# Patient Record
Sex: Male | Born: 1950 | Race: White | Hispanic: No | Marital: Married | Smoking: Former smoker
Health system: Southern US, Community
[De-identification: ages and names within clinical notes are randomized; demographics above are authoritative.]

## PROBLEM LIST (undated history)

## (undated) DIAGNOSIS — I10 Essential (primary) hypertension: Secondary | ICD-10-CM

## (undated) DIAGNOSIS — M170 Bilateral primary osteoarthritis of knee: Secondary | ICD-10-CM

## (undated) DIAGNOSIS — E119 Type 2 diabetes mellitus without complications: Secondary | ICD-10-CM

## (undated) DIAGNOSIS — E785 Hyperlipidemia, unspecified: Secondary | ICD-10-CM

## (undated) HISTORY — PX: TUMOR REMOVAL: SHX12

---

## 2006-11-14 ENCOUNTER — Emergency Department: Payer: Self-pay | Admitting: Unknown Physician Specialty

## 2009-04-14 ENCOUNTER — Ambulatory Visit: Payer: Self-pay | Admitting: General Surgery

## 2009-04-15 ENCOUNTER — Ambulatory Visit: Payer: Self-pay | Admitting: General Surgery

## 2009-04-22 ENCOUNTER — Ambulatory Visit: Payer: Self-pay | Admitting: General Surgery

## 2009-04-24 HISTORY — PX: INGUINAL HERNIA REPAIR: SHX194

## 2018-10-03 ENCOUNTER — Other Ambulatory Visit (HOSPITAL_COMMUNITY): Payer: Self-pay | Admitting: Anesthesiology

## 2018-10-03 ENCOUNTER — Other Ambulatory Visit: Payer: Self-pay | Admitting: Anesthesiology

## 2018-10-03 DIAGNOSIS — G894 Chronic pain syndrome: Secondary | ICD-10-CM

## 2018-10-03 DIAGNOSIS — M542 Cervicalgia: Secondary | ICD-10-CM

## 2018-10-03 DIAGNOSIS — M25512 Pain in left shoulder: Secondary | ICD-10-CM

## 2018-10-03 DIAGNOSIS — G8929 Other chronic pain: Secondary | ICD-10-CM

## 2018-10-17 ENCOUNTER — Other Ambulatory Visit: Payer: Self-pay | Admitting: Anesthesiology

## 2018-10-17 DIAGNOSIS — Z1389 Encounter for screening for other disorder: Secondary | ICD-10-CM

## 2018-10-17 DIAGNOSIS — Z0189 Encounter for other specified special examinations: Secondary | ICD-10-CM

## 2018-10-18 ENCOUNTER — Ambulatory Visit
Admission: RE | Admit: 2018-10-18 | Discharge: 2018-10-18 | Disposition: A | Discharge status: Home / Self Care | Payer: Medicare Other | Source: Ambulatory Visit | Attending: Anesthesiology | Admitting: Anesthesiology

## 2018-10-18 ENCOUNTER — Other Ambulatory Visit: Payer: Self-pay

## 2018-10-18 DIAGNOSIS — M25512 Pain in left shoulder: Secondary | ICD-10-CM

## 2018-10-18 DIAGNOSIS — G8929 Other chronic pain: Secondary | ICD-10-CM | POA: Insufficient documentation

## 2018-10-18 DIAGNOSIS — M542 Cervicalgia: Secondary | ICD-10-CM

## 2018-10-18 DIAGNOSIS — Z0189 Encounter for other specified special examinations: Secondary | ICD-10-CM

## 2018-10-18 DIAGNOSIS — Z1389 Encounter for screening for other disorder: Secondary | ICD-10-CM | POA: Diagnosis present

## 2018-10-18 DIAGNOSIS — G894 Chronic pain syndrome: Secondary | ICD-10-CM

## 2020-05-24 ENCOUNTER — Other Ambulatory Visit
Admission: RE | Admit: 2020-05-24 | Discharge: 2020-05-24 | Disposition: A | Discharge status: Home / Self Care | Payer: Medicare Other | Source: Ambulatory Visit | Attending: Pediatrics | Admitting: Pediatrics

## 2020-05-24 ENCOUNTER — Emergency Department: Payer: Medicare Other

## 2020-05-24 ENCOUNTER — Emergency Department
Admission: EM | Admit: 2020-05-24 | Discharge: 2020-05-24 | Disposition: A | Discharge status: Home / Self Care | Payer: Medicare Other | Attending: Emergency Medicine | Admitting: Emergency Medicine

## 2020-05-24 ENCOUNTER — Other Ambulatory Visit: Payer: Self-pay

## 2020-05-24 DIAGNOSIS — R42 Dizziness and giddiness: Secondary | ICD-10-CM | POA: Insufficient documentation

## 2020-05-24 DIAGNOSIS — Z03818 Encounter for observation for suspected exposure to other biological agents ruled out: Secondary | ICD-10-CM | POA: Insufficient documentation

## 2020-05-24 DIAGNOSIS — J1282 Pneumonia due to coronavirus disease 2019: Secondary | ICD-10-CM | POA: Diagnosis not present

## 2020-05-24 DIAGNOSIS — I10 Essential (primary) hypertension: Secondary | ICD-10-CM | POA: Insufficient documentation

## 2020-05-24 DIAGNOSIS — R0602 Shortness of breath: Secondary | ICD-10-CM | POA: Insufficient documentation

## 2020-05-24 DIAGNOSIS — U071 COVID-19: Secondary | ICD-10-CM | POA: Diagnosis not present

## 2020-05-24 HISTORY — DX: Essential (primary) hypertension: I10

## 2020-05-24 LAB — BASIC METABOLIC PANEL
Anion gap: 12 (ref 5–15)
BUN: 26 mg/dL — ABNORMAL HIGH (ref 8–23)
CO2: 24 mmol/L (ref 22–32)
Calcium: 9.1 mg/dL (ref 8.9–10.3)
Chloride: 105 mmol/L (ref 98–111)
Creatinine, Ser: 1.26 mg/dL — ABNORMAL HIGH (ref 0.61–1.24)
GFR, Estimated: >60 mL/min (ref >60)
Glucose, Bld: 121 mg/dL — ABNORMAL HIGH (ref 70–99)
Potassium: 3.8 mmol/L (ref 3.5–5.1)
Sodium: 141 mmol/L (ref 135–145)

## 2020-05-24 LAB — FIBRIN DERIVATIVES D-DIMER (ARMC ONLY): Fibrin derivatives D-dimer (ARMC): 602.39 ng/mL (FEU) — ABNORMAL HIGH (ref 0.00–499.00)

## 2020-05-24 LAB — CBC
HCT: 43.9 % (ref 39.0–52.0)
Hemoglobin: 15.1 g/dL (ref 13.0–17.0)
MCH: 31.9 pg (ref 26.0–34.0)
MCHC: 34.4 g/dL (ref 30.0–36.0)
MCV: 92.8 fL (ref 80.0–100.0)
Platelets: 162 10*3/uL (ref 150–400)
RBC: 4.73 MIL/uL (ref 4.22–5.81)
RDW: 13 % (ref 11.5–15.5)
WBC: 5.2 10*3/uL (ref 4.0–10.5)
nRBC: 0 % (ref 0.0–0.2)

## 2020-05-24 LAB — TROPONIN I (HIGH SENSITIVITY)
Troponin I (High Sensitivity): 5 ng/L (ref <18)
Troponin I (High Sensitivity): 8 ng/L (ref <18)

## 2020-05-24 MED ORDER — PREDNISONE 10 MG PO TABS: 40.0000 mg | ORAL_TABLET | Freq: Every day | ORAL | 0 refills | Status: DC

## 2020-05-24 MED ORDER — PREDNISONE 10 MG PO TABS: 40.0000 mg | ORAL_TABLET | Freq: Every day | ORAL | 0 refills | Status: AC

## 2020-05-24 MED ORDER — AZITHROMYCIN 250 MG PO TABS: ORAL_TABLET | ORAL | 0 refills | Status: AC

## 2020-05-24 MED ORDER — ONDANSETRON 4 MG PO TBDP: 4.0000 mg | ORAL_TABLET | Freq: Three times a day (TID) | ORAL | 0 refills | Status: DC | PRN

## 2020-05-24 MED ORDER — IOHEXOL 350 MG/ML SOLN
75.0000 mL | Freq: Once | INTRAVENOUS | Status: AC | PRN
  Administered 2020-05-24: 75 mL via INTRAVENOUS
  Filled 2020-05-24: qty 75

## 2020-05-24 MED ORDER — AZITHROMYCIN 250 MG PO TABS: ORAL_TABLET | ORAL | 0 refills | Status: DC

## 2020-05-24 MED ORDER — SODIUM CHLORIDE 0.9 % IV BOLUS
500.0000 mL | Freq: Once | INTRAVENOUS | Status: AC
  Administered 2020-05-24: 500 mL via INTRAVENOUS

## 2020-05-24 NOTE — ED Triage Notes (Addendum)
Pt comes via [facility] with c/o CP and COVID+. Pt states he went to [facility] for his dizziness and CP. Pt states this started couple weeks ago and has just gotten worse. Pt states they tested him for COVID at [facility] and it was +.  Pt states mid sternal CP. Pt states SOB. Pt states weakness.  Pt states they did a xray and blood work at [facility]. No new xray ordered.

## 2020-05-24 NOTE — ED Notes (Signed)
Pt ambulated to/from toilet. Well tolerated. Steady on feet. SpO2 at start was 94%, 97% while ambulating. No dizziness, SOB, lightheadedness, weakness. EDP notified

## 2020-05-24 NOTE — ED Provider Notes (Signed)
[facility] Emergency Department Provider Note  ____________________________________________   Event Date/Time   First MD Initiated Contact with Patient 05/24/20 1938     (approximate)  I have reviewed the triage vital signs    HISTORY  Chief Complaint Chest Pain and Covid Positive    HPI Alec Jennings is a 70 y.o. male who presents with viral symptoms. Pt reports multiple symptoms including dizziness, chest pain, shortness of breath. Patient was Covid positive but also had a D-dimer that was elevated at his primary care doctor so sent in for CT scan to rule out pulmonary embolism he does report some chest pain and pain with breathing that is moderate, constant, nothing makes it better nothing makes it worse. He states that he has been having symptoms for 3 weeks but is got worse over the past 3 days. He is not vaccinated.     Past Medical History:  Diagnosis Date  . Hypertension     There are no problems to display for this patient.   History reviewed. No pertinent surgical history.  Prior to Admission medications   Not on File    Allergies Patient has no allergy information on record.  No family history on file.  Social History      Review of Systems Constitutional: No fever/chills Eyes: No visual changes. ENT: No sore throat. Cardiovascular: Chest pain Respiratory:  shortness of breath Gastrointestinal: No abdominal pain.  No nausea, no vomiting.  No diarrhea.  No constipation. Genitourinary: Negative for dysuria. Musculoskeletal: Negative for back pain. Skin: Negative for rash. Neurological: Negative for headaches, focal weakness or numbness. All other ROS negative ____________________________________________   PHYSICAL EXAM:  VITAL SIGNS: ED Triage Vitals  Enc Vitals Group     BP 05/24/20 1546 (!) 146/89     Pulse Rate 05/24/20 1546 96     Resp 05/24/20 1546 19     Temp 05/24/20 1546 98.4 F (36.9 C)     Temp Source  05/24/20 1908 Oral     SpO2 05/24/20 1546 96 %     Weight --      Height --      Head Circumference --      Peak Flow --      Pain Score 05/24/20 1542 3     Pain Loc --      Pain Edu? --      Excl. in GC? --     Constitutional: Alert and oriented. Well appearing and in no acute distress. Eyes: Conjunctivae are normal. EOMI. Head: Atraumatic. Nose: No congestion/rhinnorhea. Mouth/Throat: Mucous membranes are moist.   Neck: No stridor. Trachea Midline. FROM Cardiovascular: Normal rate, regular rhythm. Good peripheral circulation. Respiratory: no audible stridor, no increased work of breathing  Gastrointestinal: Soft and nontender. No distention.  Musculoskeletal: No lower extremity tenderness nor edema.  No joint effusions. Neurologic:  Normal speech and language. No gross focal neurologic deficits are appreciated.  Skin:  Skin is warm, dry and intact. No rash noted. Psychiatric: Mood and affect are normal. Speech and behavior are normal. GU: Deferred   ____________________________________________   LABS (all labs ordered are listed, but only abnormal results are displayed)  Labs Reviewed  BASIC METABOLIC PANEL - Abnormal; Notable for the following components:      Result Value   Glucose, Bld 121 (*)    BUN 26 (*)    Creatinine, Ser 1.26 (*)    All other components within normal limits  CBC  TROPONIN I (HIGH  SENSITIVITY)  TROPONIN I (HIGH SENSITIVITY)   ____________________________________________   RADIOLOGY   Official radiology report(s): CT Angio Chest PE W and/or Wo Contrast  Result Date: 05/24/2020 CLINICAL DATA:  PE suspected.  Chest pain.  COVID positive. EXAM: CT ANGIOGRAPHY CHEST WITH CONTRAST TECHNIQUE: Multidetector CT imaging of the chest was performed using the standard protocol during bolus administration of intravenous contrast. Multiplanar CT image reconstructions and MIPs were obtained to evaluate the vascular anatomy. CONTRAST:  31mL OMNIPAQUE  IOHEXOL 350 MG/ML SOLN COMPARISON:  None. FINDINGS: Cardiovascular: Contrast injection is sufficient to demonstrate satisfactory opacification of the pulmonary arteries to the segmental level. There is no pulmonary embolus or evidence of right heart strain. The size of the main pulmonary artery is normal. Heart size is normal, with no pericardial effusion. The course and caliber of the aorta are normal. There is no atherosclerotic calcification. Opacification decreased due to pulmonary arterial phase contrast bolus timing. Mediastinum/Nodes: -- No mediastinal lymphadenopathy. -- No hilar lymphadenopathy. -- No axillary lymphadenopathy. -- No supraclavicular lymphadenopathy. -- Normal thyroid gland where visualized. -  Unremarkable esophagus. Lungs/Pleura: There is scattered bilateral ground-glass airspace opacities. There are emphysematous changes. No pneumothorax. There are trace bilateral pleural effusions. The trachea is unremarkable. Upper Abdomen: Contrast bolus timing is not optimized for evaluation of the abdominal organs. The visualized portions of the organs of the upper abdomen are normal. Musculoskeletal: No chest wall abnormality. No bony spinal canal stenosis. Review of the MIP images confirms the above findings. IMPRESSION: 1. No acute pulmonary embolism. 2. Bilateral ground-glass airspace opacities, consistent with the patient's history of viral pneumonia. 3. Trace bilateral pleural effusions. Emphysema (ICD10-J43.9). Electronically Signed   By: Katherine Mantle M.D.   On: 05/24/2020 21:41    ____________________________________________   PROCEDURES  Procedure(s) performed (including Critical Care):  Procedures   ____________________________________________   INITIAL IMPRESSION / ASSESSMENT AND PLAN / ED COURSE  [name] was evaluated in Emergency Department on 05/24/2020 for the symptoms described in the history of present illness. He was evaluated in the context of the global  COVID-19 pandemic, which necessitated consideration that the patient might be at risk for infection with the SARS-CoV-2 virus that causes COVID-19. Institutional protocols and algorithms that pertain to the evaluation of patients at risk for COVID-19 are in a state of rapid change based on information released by regulatory bodies including the CDC and federal and state organizations. These policies and algorithms were followed during the patient's care in the ED.     Pt presents with multiple symptoms.  Given the prevalence of  COVID 19 I suspect this is mostly likely secondary to viral illness such as COVID 19. Pt had + test at PCP.   Well hydrated on exam, low suspicion for electrolyte abnormalities or AKI. Pt not hypoxic and breathing well therefore does not require admission to hospital. Patient's is reporting chest pains will get cardiac markers and EKG. He was sent here because his D-dimer was elevated so we will get CT PE supply suspect this is mostly just elevated from his COVID-19   No signs of a heart attack.  Kidney function slightly elevated at 1.26. 500 cc of fluid given  Patient's oxygen level was 94% and with ambulation went no lower than that.  He denied any significant shortness of breath or dizziness.  CT scan did not show any evidence of PE just showed signs of his Covid pneumonia.  Discussed symptoms management with tylenol, steroids, azithromycin.  Discussed with patient that if  his shortness of breath is worsening he should return to the ER for repeat oxygen level testing.        ____________________________________________   FINAL CLINICAL IMPRESSION(S) / ED DIAGNOSES   Final diagnoses:  Pneumonia due to COVID-19 virus      MEDICATIONS GIVEN DURING THIS VISIT:  Medications  sodium chloride 0.9 % bolus 500 mL (0 mLs Intravenous Stopped 05/24/20 2213)  iohexol (OMNIPAQUE) 350 MG/ML injection 75 mL (75 mLs Intravenous Contrast Given 05/24/20 2127)     ED  Discharge Orders         Ordered    ondansetron (ZOFRAN ODT) 4 MG disintegrating tablet  Every 8 hours PRN        05/24/20 2217    predniSONE (DELTASONE) 10 MG tablet  Daily        05/24/20 2217    azithromycin (ZITHROMAX Z-PAK) 250 MG tablet        05/24/20 2217    Ambulatory referral for Covid Treatment        05/24/20 2218           Note:  This document was prepared using Dragon voice recognition software and may include unintentional dictation errors.   Concha Se, MD 05/24/20 2219

## 2020-05-24 NOTE — Discharge Instructions (Signed)
Take the steroids and azithromycin to help with the Covid that is in your lungs.  Take Zofran to help with nausea.  Take Tylenol 1 g every 8 hours to help with any pain and fevers.  Your kidney function was slightly elevated which can be a sign of some mild dehydration so drink plenty of fluids such as Gatorade without sugar in it or Pedialyte.  Return to the ER if you develop worsening shortness of breath you your oxygen level will need to be rechecked for consideration of admission.

## 2020-05-25 ENCOUNTER — Telehealth: Payer: Self-pay | Admitting: Adult Health

## 2020-05-25 NOTE — Telephone Encounter (Signed)
Called to discuss with patient about COVID-19 symptoms and the use of one of the available treatments for those with mild to moderate Covid symptoms and at a high risk of hospitalization.  Pt appears to qualify for outpatient treatment due to co-morbid conditions and/or a member of an at-risk group in accordance with the FDA Emergency Use Authorization.    Symptom onset: >7 d Vaccinated: No  Booster? No  Immunocompromised? No  Qualifiers: Yes   Unable to reach pt - Reached patient , Sx >7d, outside of OP treatment options for Covid with MAB and antivirals. Advised to continue with ER recs  Please contact PCP  for sooner follow up if symptoms do not improve or worsen or seek emergency care    Milah Recht NP-  C

## 2021-03-20 ENCOUNTER — Other Ambulatory Visit: Payer: Self-pay | Admitting: Orthopedic Surgery

## 2021-03-20 DIAGNOSIS — M2419 Other articular cartilage disorders, other specified site: Secondary | ICD-10-CM

## 2021-03-20 DIAGNOSIS — M2559 Pain in other specified joint: Secondary | ICD-10-CM

## 2021-03-20 DIAGNOSIS — Z01818 Encounter for other preprocedural examination: Secondary | ICD-10-CM

## 2021-03-29 ENCOUNTER — Other Ambulatory Visit
Admission: RE | Admit: 2021-03-29 | Discharge: 2021-03-29 | Disposition: A | Discharge status: Home / Self Care | Payer: Medicare Other | Source: Ambulatory Visit | Attending: Orthopedic Surgery | Admitting: Orthopedic Surgery

## 2021-03-29 ENCOUNTER — Other Ambulatory Visit: Payer: Self-pay

## 2021-03-29 VITALS — BP 159/81 | HR 82 | Temp 98.0°F | Resp 12 | Ht 70.5 in | Wt 222.0 lb

## 2021-03-29 DIAGNOSIS — I10 Essential (primary) hypertension: Secondary | ICD-10-CM | POA: Insufficient documentation

## 2021-03-29 DIAGNOSIS — M2419 Other articular cartilage disorders, other specified site: Secondary | ICD-10-CM | POA: Insufficient documentation

## 2021-03-29 DIAGNOSIS — Z01818 Encounter for other preprocedural examination: Secondary | ICD-10-CM | POA: Insufficient documentation

## 2021-03-29 DIAGNOSIS — Z01812 Encounter for preprocedural laboratory examination: Secondary | ICD-10-CM

## 2021-03-29 DIAGNOSIS — Z20822 Contact with and (suspected) exposure to covid-19: Secondary | ICD-10-CM | POA: Insufficient documentation

## 2021-03-29 DIAGNOSIS — M2559 Pain in other specified joint: Secondary | ICD-10-CM | POA: Diagnosis not present

## 2021-03-29 DIAGNOSIS — E119 Type 2 diabetes mellitus without complications: Secondary | ICD-10-CM | POA: Diagnosis not present

## 2021-03-29 HISTORY — DX: Hyperlipidemia, unspecified: E78.5

## 2021-03-29 HISTORY — DX: Bilateral primary osteoarthritis of knee: M17.0

## 2021-03-29 HISTORY — DX: Type 2 diabetes mellitus without complications: E11.9

## 2021-03-29 LAB — URINALYSIS, COMPLETE (UACMP) WITH MICROSCOPIC
Bacteria, UA: NONE SEEN
Bilirubin Urine: NEGATIVE
Glucose, UA: NEGATIVE mg/dL
Hgb urine dipstick: NEGATIVE
Ketones, ur: NEGATIVE mg/dL
Leukocytes,Ua: NEGATIVE
Nitrite: NEGATIVE
Protein, ur: NEGATIVE mg/dL
Specific Gravity, Urine: 1.011 (ref 1.005–1.030)
Squamous Epithelial / HPF: NONE SEEN (ref 0–5)
WBC, UA: NONE SEEN WBC/hpf (ref 0–5)
pH: 5 (ref 5.0–8.0)

## 2021-03-29 LAB — BASIC METABOLIC PANEL
Anion gap: 7 (ref 5–15)
BUN: 24 mg/dL — ABNORMAL HIGH (ref 8–23)
CO2: 28 mmol/L (ref 22–32)
Calcium: 9.2 mg/dL (ref 8.9–10.3)
Chloride: 103 mmol/L (ref 98–111)
Creatinine, Ser: 1.36 mg/dL — ABNORMAL HIGH (ref 0.61–1.24)
GFR, Estimated: 56 mL/min — ABNORMAL LOW (ref >60)
Glucose, Bld: 96 mg/dL (ref 70–99)
Potassium: 3.4 mmol/L — ABNORMAL LOW (ref 3.5–5.1)
Sodium: 138 mmol/L (ref 135–145)

## 2021-03-29 LAB — TYPE AND SCREEN
ABO/RH(D): O POS
Antibody Screen: NEGATIVE

## 2021-03-29 LAB — CBC WITH DIFFERENTIAL/PLATELET
Abs Immature Granulocytes: 0.01 10*3/uL (ref 0.00–0.07)
Basophils Absolute: 0 10*3/uL (ref 0.0–0.1)
Basophils Relative: 1 %
Eosinophils Absolute: 0.2 10*3/uL (ref 0.0–0.5)
Eosinophils Relative: 4 %
HCT: 39.9 % (ref 39.0–52.0)
Hemoglobin: 13.9 g/dL (ref 13.0–17.0)
Immature Granulocytes: 0 %
Lymphocytes Relative: 39 %
Lymphs Abs: 2.1 10*3/uL (ref 0.7–4.0)
MCH: 32.4 pg (ref 26.0–34.0)
MCHC: 34.8 g/dL (ref 30.0–36.0)
MCV: 93 fL (ref 80.0–100.0)
Monocytes Absolute: 0.5 10*3/uL (ref 0.1–1.0)
Monocytes Relative: 9 %
Neutro Abs: 2.5 10*3/uL (ref 1.7–7.7)
Neutrophils Relative %: 47 %
Platelets: 194 10*3/uL (ref 150–400)
RBC: 4.29 MIL/uL (ref 4.22–5.81)
RDW: 13.8 % (ref 11.5–15.5)
WBC: 5.4 10*3/uL (ref 4.0–10.5)
nRBC: 0 % (ref 0.0–0.2)

## 2021-03-29 LAB — APTT: aPTT: 33 seconds (ref 24–36)

## 2021-03-29 LAB — SURGICAL PCR SCREEN
MRSA, PCR: NEGATIVE
Staphylococcus aureus: NEGATIVE

## 2021-03-29 LAB — PROTIME-INR
INR: 1.1 (ref 0.8–1.2)
Prothrombin Time: 13.7 seconds (ref 11.4–15.2)

## 2021-03-29 NOTE — Patient Instructions (Addendum)
Your procedure is scheduled on: Thursday, December 8 Report to the Registration Desk on the 1st floor of the [facility] To find out your arrival time, please call [phone] between 1PM - 3PM on: Wednesday, December 7  REMEMBER: Instructions that are not followed completely may result in serious medical risk, up to and including death; or upon the discretion of your surgeon and anesthesiologist your surgery may need to be rescheduled.  Do not eat food after midnight the night before surgery.  No gum chewing, lozengers or hard candies.  You may however, drink CLEAR liquids up to 2 hours before you are scheduled to arrive for your surgery. Do not drink anything within 2 hours of your scheduled arrival time.  TAKE THESE MEDICATIONS THE MORNING OF SURGERY WITH A SIP OF WATER:  Metoprolol  Stop Metformin 2 days prior to surgery. Last day to take is Monday, December 5. Resume AFTER surgery.  One week prior to surgery: Stop aspirin, diclofenac and Anti-inflammatories (NSAIDS) such as Advil, Aleve, Ibuprofen, Motrin, Naproxen, Naprosyn and Aspirin based products such as Excedrin, Goodys Powder, BC Powder. Stop ANY OVER THE COUNTER supplements until after surgery. Stop vitamin C You may however, continue to take Tylenol if needed for pain up until the day of surgery.  No Alcohol for 24 hours before or after surgery.  No Smoking including e-cigarettes for 24 hours prior to surgery.  No chewable tobacco products for at least 6 hours prior to surgery.  No nicotine patches on the day of surgery.  Do not use any "recreational" drugs for at least a week prior to your surgery.  Please be advised that the combination of cocaine and anesthesia may have negative outcomes, up to and including death. If you test positive for cocaine, your surgery will be cancelled.  On the morning of surgery brush your teeth with toothpaste and water, you may rinse your mouth with mouthwash if you wish. Do not  swallow any toothpaste or mouthwash.  Use CHG Soap as directed on instruction sheet.  Do not wear jewelry.  Do not wear lotions, powders, or perfumes.   Do not shave body from the neck down 48 hours prior to surgery just in case you cut yourself which could leave a site for infection.  Also, freshly shaved skin may become irritated if using the CHG soap.  Do not bring valuables to the hospital. [facility] is not responsible for any missing/lost belongings or valuables.   Notify your doctor if there is any change in your medical condition (cold, fever, infection).  Wear comfortable clothing (specific to your surgery type) to the hospital.  After surgery, you can help prevent lung complications by doing breathing exercises.  Take deep breaths and cough every 1-2 hours. Your doctor may order a device called an Incentive Spirometer to help you take deep breaths.  If you are being admitted to the hospital overnight, leave your suitcase in the car. After surgery it may be brought to your room.  If you are being discharged the day of surgery, you will not be allowed to drive home. You will need a responsible adult (18 years or older) to drive you home and stay with you that night.   If you are taking public transportation, you will need to have a responsible adult (18 years or older) with you. Please confirm with your physician that it is acceptable to use public transportation.   Please call the Pre-admissions Testing Dept. at [phone] if you  have any questions about these instructions.  Surgery Visitation Policy:  Patients undergoing a surgery or procedure may have one family member or support person with them as long as that person is not COVID-19 positive or experiencing its symptoms.  That person may remain in the waiting area during the procedure and may rotate out with other people.  Inpatient Visitation:    Visiting hours are 7 a.m. to 8 p.m. Up to two visitors ages 16+  are allowed at one time in a patient room. The visitors may rotate out with other people during the day. Visitors must check out when they leave, or other visitors will not be allowed. One designated support person may remain overnight. The visitor must pass COVID-19 screenings, use hand sanitizer when entering and exiting the patient's room and wear a mask at all times, including in the patient's room. Patients must also wear a mask when staff or their visitor are in the room. Masking is required regardless of vaccination status.

## 2021-03-30 LAB — SARS CORONAVIRUS 2 (TAT 6-24 HRS): SARS Coronavirus 2: NEGATIVE

## 2021-03-31 ENCOUNTER — Encounter
Admission: RE | Disposition: A | Discharge status: Home / Self Care | Payer: Self-pay | Source: Home / Self Care | Attending: Orthopedic Surgery

## 2021-03-31 ENCOUNTER — Ambulatory Visit: Payer: Medicare Other

## 2021-03-31 ENCOUNTER — Ambulatory Visit: Payer: Medicare Other | Admitting: Anesthesiology

## 2021-03-31 ENCOUNTER — Observation Stay
Admission: RE | Admit: 2021-03-31 | Discharge: 2021-04-02 | Disposition: A | Discharge status: Home / Self Care | Payer: Medicare Other | Attending: Orthopedic Surgery | Admitting: Orthopedic Surgery

## 2021-03-31 ENCOUNTER — Other Ambulatory Visit: Payer: Self-pay

## 2021-03-31 ENCOUNTER — Encounter: Payer: Self-pay | Admitting: Orthopedic Surgery

## 2021-03-31 DIAGNOSIS — E119 Type 2 diabetes mellitus without complications: Secondary | ICD-10-CM | POA: Insufficient documentation

## 2021-03-31 DIAGNOSIS — Z79899 Other long term (current) drug therapy: Secondary | ICD-10-CM | POA: Diagnosis not present

## 2021-03-31 DIAGNOSIS — Z7984 Long term (current) use of oral hypoglycemic drugs: Secondary | ICD-10-CM | POA: Insufficient documentation

## 2021-03-31 DIAGNOSIS — I10 Essential (primary) hypertension: Secondary | ICD-10-CM | POA: Insufficient documentation

## 2021-03-31 DIAGNOSIS — M1712 Unilateral primary osteoarthritis, left knee: Secondary | ICD-10-CM | POA: Diagnosis not present

## 2021-03-31 DIAGNOSIS — Z7982 Long term (current) use of aspirin: Secondary | ICD-10-CM | POA: Insufficient documentation

## 2021-03-31 DIAGNOSIS — Z87891 Personal history of nicotine dependence: Secondary | ICD-10-CM | POA: Insufficient documentation

## 2021-03-31 DIAGNOSIS — Z96652 Presence of left artificial knee joint: Secondary | ICD-10-CM

## 2021-03-31 HISTORY — PX: TOTAL KNEE ARTHROPLASTY: SHX125

## 2021-03-31 LAB — CBC
HCT: 35.5 % — ABNORMAL LOW (ref 39.0–52.0)
Hemoglobin: 12.5 g/dL — ABNORMAL LOW (ref 13.0–17.0)
MCH: 32.6 pg (ref 26.0–34.0)
MCHC: 35.2 g/dL (ref 30.0–36.0)
MCV: 92.4 fL (ref 80.0–100.0)
Platelets: 175 10*3/uL (ref 150–400)
RBC: 3.84 MIL/uL — ABNORMAL LOW (ref 4.22–5.81)
RDW: 14.1 % (ref 11.5–15.5)
WBC: 5.9 10*3/uL (ref 4.0–10.5)
nRBC: 0 % (ref 0.0–0.2)

## 2021-03-31 LAB — GLUCOSE, CAPILLARY
Glucose-Capillary: 126 mg/dL — ABNORMAL HIGH (ref 70–99)
Glucose-Capillary: 114 mg/dL — ABNORMAL HIGH (ref 70–99)
Glucose-Capillary: 121 mg/dL — ABNORMAL HIGH (ref 70–99)

## 2021-03-31 LAB — CREATININE, SERUM
Creatinine, Ser: 1.3 mg/dL — ABNORMAL HIGH (ref 0.61–1.24)
GFR, Estimated: 59 mL/min — ABNORMAL LOW (ref >60)

## 2021-03-31 LAB — ABO/RH: ABO/RH(D): O POS

## 2021-03-31 SURGERY — ARTHROPLASTY, KNEE, TOTAL
Anesthesia: Spinal | Site: Knee | Laterality: Left

## 2021-03-31 MED ORDER — EPHEDRINE SULFATE 50 MG/ML IJ SOLN
INTRAMUSCULAR | Status: DC | PRN
Start: 2021-03-31 — End: 2021-03-31
  Administered 2021-03-31 (×2): 5 mg via INTRAVENOUS

## 2021-03-31 MED ORDER — OXYCODONE HCL 5 MG PO TABS
5.0000 mg | ORAL_TABLET | ORAL | Status: DC | PRN
  Administered 2021-04-01: 5 mg via ORAL
  Administered 2021-04-01 – 2021-04-02 (×2): 10 mg via ORAL
  Filled 2021-03-31: qty 1
  Filled 2021-03-31 (×2): qty 2

## 2021-03-31 MED ORDER — OXYCODONE HCL 5 MG PO TABS: 5.0000 mg | ORAL_TABLET | Freq: Once | ORAL | Status: DC | PRN

## 2021-03-31 MED ORDER — TRAMADOL HCL 50 MG PO TABS
50.0000 mg | ORAL_TABLET | Freq: Four times a day (QID) | ORAL | Status: DC
  Administered 2021-03-31 – 2021-04-02 (×9): 50 mg via ORAL
  Filled 2021-03-31 (×9): qty 1

## 2021-03-31 MED ORDER — METFORMIN HCL 500 MG PO TABS
1000.0000 mg | ORAL_TABLET | Freq: Two times a day (BID) | ORAL | Status: DC
  Administered 2021-03-31 – 2021-04-02 (×4): 1000 mg via ORAL
  Filled 2021-03-31 (×4): qty 2

## 2021-03-31 MED ORDER — PHENYLEPHRINE HCL-NACL 20-0.9 MG/250ML-% IV SOLN
INTRAVENOUS | Status: AC
  Filled 2021-03-31: qty 250

## 2021-03-31 MED ORDER — PROPOFOL 10 MG/ML IV BOLUS
INTRAVENOUS | Status: DC | PRN
  Administered 2021-03-31: 40 mg via INTRAVENOUS

## 2021-03-31 MED ORDER — MORPHINE SULFATE (PF) 4 MG/ML IV SOLN
INTRAVENOUS | Status: AC
  Filled 2021-03-31: qty 1

## 2021-03-31 MED ORDER — PHENOL 1.4 % MT LIQD
1.0000 | OROMUCOSAL | Status: DC | PRN
  Filled 2021-03-31: qty 177

## 2021-03-31 MED ORDER — OXYCODONE HCL 5 MG/5ML PO SOLN: 5.0000 mg | Freq: Once | ORAL | Status: DC | PRN

## 2021-03-31 MED ORDER — NEOMYCIN-POLYMYXIN B GU 40-200000 IR SOLN
Status: DC | PRN
  Administered 2021-03-31: 14 mL

## 2021-03-31 MED ORDER — ONDANSETRON HCL 4 MG/2ML IJ SOLN: 4.0000 mg | Freq: Once | INTRAMUSCULAR | Status: DC | PRN

## 2021-03-31 MED ORDER — DIPHENHYDRAMINE HCL 12.5 MG/5ML PO ELIX: 12.5000 mg | ORAL_SOLUTION | ORAL | Status: DC | PRN

## 2021-03-31 MED ORDER — BUPIVACAINE HCL (PF) 0.5 % IJ SOLN
INTRAMUSCULAR | Status: AC
  Filled 2021-03-31: qty 10

## 2021-03-31 MED ORDER — SENNOSIDES-DOCUSATE SODIUM 8.6-50 MG PO TABS
1.0000 | ORAL_TABLET | Freq: Every evening | ORAL | Status: DC | PRN
  Administered 2021-04-01: 1 via ORAL
  Filled 2021-03-31: qty 1

## 2021-03-31 MED ORDER — SODIUM CHLORIDE 0.9 % IV SOLN: INTRAVENOUS | Status: DC

## 2021-03-31 MED ORDER — BISACODYL 5 MG PO TBEC
5.0000 mg | DELAYED_RELEASE_TABLET | Freq: Every day | ORAL | Status: DC | PRN
  Administered 2021-04-02: 5 mg via ORAL
  Filled 2021-03-31: qty 1

## 2021-03-31 MED ORDER — ONDANSETRON HCL 4 MG PO TABS: 4.0000 mg | ORAL_TABLET | Freq: Four times a day (QID) | ORAL | Status: DC | PRN

## 2021-03-31 MED ORDER — EZETIMIBE 10 MG PO TABS
10.0000 mg | ORAL_TABLET | Freq: Every day | ORAL | Status: DC
  Administered 2021-03-31 – 2021-04-01 (×2): 10 mg via ORAL
  Filled 2021-03-31 (×3): qty 1

## 2021-03-31 MED ORDER — MORPHINE SULFATE 4 MG/ML IJ SOLN
INTRAMUSCULAR | Status: DC | PRN
  Administered 2021-03-31: 4 mg

## 2021-03-31 MED ORDER — BUPIVACAINE HCL (PF) 0.5 % IJ SOLN
INTRAMUSCULAR | Status: DC | PRN
  Administered 2021-03-31: 2.5 mL

## 2021-03-31 MED ORDER — METOPROLOL SUCCINATE ER 50 MG PO TB24
50.0000 mg | ORAL_TABLET | Freq: Every day | ORAL | Status: DC
  Administered 2021-04-01 – 2021-04-02 (×2): 50 mg via ORAL
  Filled 2021-03-31 (×2): qty 1

## 2021-03-31 MED ORDER — SODIUM CHLORIDE 0.9 % IR SOLN
Status: DC | PRN
  Administered 2021-03-31: 3000 mL
  Administered 2021-03-31: 500 mL

## 2021-03-31 MED ORDER — BUPIVACAINE LIPOSOME 1.3 % IJ SUSP
INTRAMUSCULAR | Status: DC | PRN
  Administered 2021-03-31: 20 mL

## 2021-03-31 MED ORDER — CHLORHEXIDINE GLUCONATE CLOTH 2 % EX PADS: 6.0000 | MEDICATED_PAD | Freq: Once | CUTANEOUS | Status: DC

## 2021-03-31 MED ORDER — DOCUSATE SODIUM 100 MG PO CAPS
100.0000 mg | ORAL_CAPSULE | Freq: Two times a day (BID) | ORAL | Status: DC
  Administered 2021-03-31 – 2021-04-02 (×4): 100 mg via ORAL
  Filled 2021-03-31 (×4): qty 1

## 2021-03-31 MED ORDER — EPHEDRINE 5 MG/ML INJ
INTRAVENOUS | Status: AC
  Filled 2021-03-31: qty 5

## 2021-03-31 MED ORDER — ENOXAPARIN SODIUM 40 MG/0.4ML IJ SOSY
40.0000 mg | PREFILLED_SYRINGE | INTRAMUSCULAR | Status: DC
  Administered 2021-04-01 – 2021-04-02 (×2): 40 mg via SUBCUTANEOUS
  Filled 2021-03-31 (×2): qty 0.4

## 2021-03-31 MED ORDER — VITAMIN B-12 1000 MCG PO TABS
1000.0000 ug | ORAL_TABLET | Freq: Every day | ORAL | Status: DC
  Administered 2021-04-01 – 2021-04-02 (×2): 1000 ug via ORAL
  Filled 2021-03-31 (×2): qty 1

## 2021-03-31 MED ORDER — HYDROMORPHONE HCL 1 MG/ML IJ SOLN: 0.5000 mg | INTRAMUSCULAR | Status: DC | PRN

## 2021-03-31 MED ORDER — METHOCARBAMOL 1000 MG/10ML IJ SOLN
500.0000 mg | Freq: Four times a day (QID) | INTRAVENOUS | Status: DC | PRN
  Filled 2021-03-31: qty 5

## 2021-03-31 MED ORDER — ACETAMINOPHEN 500 MG PO TABS
1000.0000 mg | ORAL_TABLET | Freq: Four times a day (QID) | ORAL | Status: AC
  Administered 2021-03-31 (×3): 1000 mg via ORAL
  Filled 2021-03-31 (×5): qty 2

## 2021-03-31 MED ORDER — FENTANYL CITRATE (PF) 100 MCG/2ML IJ SOLN: 25.0000 ug | INTRAMUSCULAR | Status: DC | PRN

## 2021-03-31 MED ORDER — BUPIVACAINE-EPINEPHRINE (PF) 0.25% -1:200000 IJ SOLN
INTRAMUSCULAR | Status: AC
  Filled 2021-03-31: qty 30

## 2021-03-31 MED ORDER — LORATADINE 10 MG PO TABS
10.0000 mg | ORAL_TABLET | Freq: Every day | ORAL | Status: DC
  Administered 2021-03-31 – 2021-04-02 (×3): 10 mg via ORAL
  Filled 2021-03-31 (×3): qty 1

## 2021-03-31 MED ORDER — SODIUM CHLORIDE 0.9 % IV SOLN: INTRAVENOUS | Status: DC | PRN

## 2021-03-31 MED ORDER — ASPIRIN EC 81 MG PO TBEC
81.0000 mg | DELAYED_RELEASE_TABLET | Freq: Every day | ORAL | Status: DC
  Administered 2021-04-01 – 2021-04-02 (×2): 81 mg via ORAL
  Filled 2021-03-31 (×2): qty 1

## 2021-03-31 MED ORDER — ASCORBIC ACID 500 MG PO TABS
1000.0000 mg | ORAL_TABLET | Freq: Every day | ORAL | Status: DC
  Administered 2021-04-01 – 2021-04-02 (×2): 1000 mg via ORAL
  Filled 2021-03-31 (×2): qty 2

## 2021-03-31 MED ORDER — PROPOFOL 1000 MG/100ML IV EMUL
INTRAVENOUS | Status: AC
  Filled 2021-03-31: qty 100

## 2021-03-31 MED ORDER — MENTHOL 3 MG MT LOZG
1.0000 | LOZENGE | OROMUCOSAL | Status: DC | PRN
  Filled 2021-03-31: qty 9

## 2021-03-31 MED ORDER — CLINDAMYCIN PHOSPHATE 600 MG/50ML IV SOLN
600.0000 mg | Freq: Once | INTRAVENOUS | Status: AC
  Administered 2021-03-31: 600 mg via INTRAVENOUS

## 2021-03-31 MED ORDER — PROPOFOL 500 MG/50ML IV EMUL
INTRAVENOUS | Status: DC | PRN
  Administered 2021-03-31: 75 ug/kg/min via INTRAVENOUS

## 2021-03-31 MED ORDER — BUPIVACAINE LIPOSOME 1.3 % IJ SUSP
INTRAMUSCULAR | Status: AC
  Filled 2021-03-31: qty 20

## 2021-03-31 MED ORDER — METHOCARBAMOL 500 MG PO TABS
500.0000 mg | ORAL_TABLET | Freq: Four times a day (QID) | ORAL | Status: DC | PRN
  Administered 2021-04-01 – 2021-04-02 (×2): 500 mg via ORAL
  Filled 2021-03-31 (×2): qty 1

## 2021-03-31 MED ORDER — ACETAMINOPHEN 500 MG PO TABS: 1000.0000 mg | ORAL_TABLET | ORAL | Status: AC

## 2021-03-31 MED ORDER — CHLORHEXIDINE GLUCONATE 0.12 % MT SOLN: 15.0000 mL | Freq: Once | OROMUCOSAL | Status: AC

## 2021-03-31 MED ORDER — CHLORHEXIDINE GLUCONATE 0.12 % MT SOLN
OROMUCOSAL | Status: AC
  Administered 2021-03-31: 15 mL via OROMUCOSAL
  Filled 2021-03-31: qty 15

## 2021-03-31 MED ORDER — GLYCOPYRROLATE 0.2 MG/ML IJ SOLN
INTRAMUSCULAR | Status: DC | PRN
  Administered 2021-03-31: .2 mg via INTRAVENOUS

## 2021-03-31 MED ORDER — CEFAZOLIN SODIUM-DEXTROSE 2-4 GM/100ML-% IV SOLN
INTRAVENOUS | Status: AC
  Filled 2021-03-31: qty 100

## 2021-03-31 MED ORDER — FAMOTIDINE 20 MG PO TABS
ORAL_TABLET | ORAL | Status: AC
  Administered 2021-03-31: 20 mg via ORAL
  Filled 2021-03-31: qty 1

## 2021-03-31 MED ORDER — FAMOTIDINE 20 MG PO TABS: 20.0000 mg | ORAL_TABLET | Freq: Once | ORAL | Status: AC

## 2021-03-31 MED ORDER — ACETAMINOPHEN 500 MG PO TABS
ORAL_TABLET | ORAL | Status: AC
  Administered 2021-03-31: 1000 mg via ORAL
  Filled 2021-03-31: qty 2

## 2021-03-31 MED ORDER — BUPIVACAINE-EPINEPHRINE 0.25% -1:200000 IJ SOLN
INTRAMUSCULAR | Status: DC | PRN
  Administered 2021-03-31: 60 mL

## 2021-03-31 MED ORDER — NEOMYCIN-POLYMYXIN B GU 40-200000 IR SOLN
Status: AC
  Filled 2021-03-31: qty 1

## 2021-03-31 MED ORDER — ORAL CARE MOUTH RINSE: 15.0000 mL | Freq: Once | OROMUCOSAL | Status: AC

## 2021-03-31 MED ORDER — TRANEXAMIC ACID-NACL 1000-0.7 MG/100ML-% IV SOLN
1000.0000 mg | INTRAVENOUS | Status: AC
  Administered 2021-03-31: 1000 mg via INTRAVENOUS

## 2021-03-31 MED ORDER — OXYCODONE HCL 5 MG PO TABS
10.0000 mg | ORAL_TABLET | ORAL | Status: DC | PRN
  Administered 2021-03-31 – 2021-04-01 (×3): 10 mg via ORAL
  Filled 2021-03-31 (×3): qty 2

## 2021-03-31 MED ORDER — CEFAZOLIN SODIUM-DEXTROSE 2-4 GM/100ML-% IV SOLN
2.0000 g | INTRAVENOUS | Status: AC
  Administered 2021-03-31: 2 g via INTRAVENOUS

## 2021-03-31 MED ORDER — TRANEXAMIC ACID-NACL 1000-0.7 MG/100ML-% IV SOLN
INTRAVENOUS | Status: AC
  Filled 2021-03-31: qty 100

## 2021-03-31 MED ORDER — PHENYLEPHRINE HCL-NACL 20-0.9 MG/250ML-% IV SOLN
INTRAVENOUS | Status: DC | PRN
  Administered 2021-03-31: 20 ug/min via INTRAVENOUS

## 2021-03-31 MED ORDER — ONDANSETRON HCL 4 MG/2ML IJ SOLN: 4.0000 mg | Freq: Four times a day (QID) | INTRAMUSCULAR | Status: DC | PRN

## 2021-03-31 MED ORDER — FENTANYL CITRATE (PF) 100 MCG/2ML IJ SOLN
INTRAMUSCULAR | Status: AC
  Filled 2021-03-31: qty 2

## 2021-03-31 MED ORDER — ACETAMINOPHEN 325 MG PO TABS: 325.0000 mg | ORAL_TABLET | Freq: Four times a day (QID) | ORAL | Status: DC | PRN

## 2021-03-31 MED ORDER — SODIUM CHLORIDE (PF) 0.9 % IJ SOLN
INTRAMUSCULAR | Status: DC | PRN
  Administered 2021-03-31: 40 mL

## 2021-03-31 MED ORDER — SODIUM CHLORIDE FLUSH 0.9 % IV SOLN
INTRAVENOUS | Status: AC
  Filled 2021-03-31: qty 40

## 2021-03-31 MED ORDER — CLINDAMYCIN PHOSPHATE 600 MG/50ML IV SOLN
INTRAVENOUS | Status: AC
  Filled 2021-03-31: qty 50

## 2021-03-31 MED ORDER — GLYCOPYRROLATE 0.2 MG/ML IJ SOLN
INTRAMUSCULAR | Status: AC
  Filled 2021-03-31: qty 1

## 2021-03-31 SURGICAL SUPPLY — 75 items
BLADE SAW 90X13X1.19 OSCILLAT (BLADE) ×2 IMPLANT
BLADE SAW 90X25X1.19 OSCILLAT (BLADE) ×2 IMPLANT
CEMENT HV SMART SET (Cement) ×4 IMPLANT
CEMENT TIBIA MBT (Knees) ×1 IMPLANT
CNTNR SPEC 2.5X3XGRAD LEK (MISCELLANEOUS) ×1
CONT SPEC 4OZ STER OR WHT (MISCELLANEOUS) ×1
CONTAINER SPEC 2.5X3XGRAD LEK (MISCELLANEOUS) ×1 IMPLANT
COOLER POLAR GLACIER W/PUMP (MISCELLANEOUS) ×2 IMPLANT
CUFF TOURN SGL QUICK 24 (TOURNIQUET CUFF) ×1
CUFF TOURN SGL QUICK 34 (TOURNIQUET CUFF)
CUFF TRNQT CYL 24X4X16.5-23 (TOURNIQUET CUFF) ×1 IMPLANT
CUFF TRNQT CYL 34X4.125X (TOURNIQUET CUFF) IMPLANT
DRAPE 3/4 80X56 (DRAPES) ×4 IMPLANT
DRAPE IMP U-DRAPE 54X76 (DRAPES) ×4 IMPLANT
DRAPE INCISE IOBAN 66X60 STRL (DRAPES) ×2 IMPLANT
DRAPE SURG 17X11 SM STRL (DRAPES) ×4 IMPLANT
DRSG AQUACEL AG ADV 3.5X10 (GAUZE/BANDAGES/DRESSINGS) ×2 IMPLANT
DRSG MEPILEX SACRM 8.7X9.8 (GAUZE/BANDAGES/DRESSINGS) ×2 IMPLANT
DURAPREP 26ML APPLICATOR (WOUND CARE) ×8 IMPLANT
ELECT REM PT RETURN 9FT ADLT (ELECTROSURGICAL) ×2
ELECTRODE REM PT RTRN 9FT ADLT (ELECTROSURGICAL) ×1 IMPLANT
GAUZE 4X4 16PLY ~~LOC~~+RFID DBL (SPONGE) ×2 IMPLANT
GAUZE SPONGE 4X4 12PLY STRL (GAUZE/BANDAGES/DRESSINGS) ×2 IMPLANT
GLOVE SRG 8 PF TXTR STRL LF DI (GLOVE) ×2 IMPLANT
GLOVE SURG ENC TEXT LTX SZ7.5 (GLOVE) ×2 IMPLANT
GLOVE SURG ORTHO LTX SZ9 (GLOVE) ×4 IMPLANT
GLOVE SURG SYN 7.5  E (GLOVE) ×1
GLOVE SURG SYN 7.5 E (GLOVE) ×1 IMPLANT
GLOVE SURG UNDER POLY LF SZ8 (GLOVE) ×2
GLOVE SURG UNDER POLY LF SZ9 (GLOVE) ×2 IMPLANT
GOWN STRL REUS TWL 2XL XL LVL4 (GOWN DISPOSABLE) ×2 IMPLANT
GOWN STRL REUS W/ TWL LRG LVL3 (GOWN DISPOSABLE) ×1 IMPLANT
GOWN STRL REUS W/ TWL LRG LVL4 (GOWN DISPOSABLE) ×1 IMPLANT
GOWN STRL REUS W/ TWL XL LVL3 (GOWN DISPOSABLE) ×1 IMPLANT
GOWN STRL REUS W/TWL LRG LVL3 (GOWN DISPOSABLE) ×1
GOWN STRL REUS W/TWL LRG LVL4 (GOWN DISPOSABLE) ×1
GOWN STRL REUS W/TWL XL LVL3 (GOWN DISPOSABLE) ×1
HOLDER FOLEY CATH W/STRAP (MISCELLANEOUS) ×2 IMPLANT
IMMBOLIZER KNEE 19 BLUE UNIV (SOFTGOODS) ×2 IMPLANT
IMPL FEMUR SIGMA LT PS SZ 3 (Knees) ×1 IMPLANT
IMPLANT FEMUR SIGMA LT PS SZ 3 (Knees) ×2 IMPLANT
INSERT TIBIAL PFC SIG SZ3 10MM (Knees) ×2 IMPLANT
IV NS IRRIG 3000ML ARTHROMATIC (IV SOLUTION) ×2 IMPLANT
KIT TURNOVER KIT A (KITS) ×2 IMPLANT
MANIFOLD NEPTUNE II (INSTRUMENTS) ×2 IMPLANT
NDL SAFETY ECLIPSE 18X1.5 (NEEDLE) ×1 IMPLANT
NEEDLE HYPO 18GX1.5 SHARP (NEEDLE) ×1
NEEDLE HYPO 22GX1.5 SAFETY (NEEDLE) ×2 IMPLANT
NEEDLE SPNL 20GX3.5 QUINCKE YW (NEEDLE) ×2 IMPLANT
NS IRRIG 1000ML POUR BTL (IV SOLUTION) IMPLANT
NS IRRIG 500ML POUR BTL (IV SOLUTION) ×2 IMPLANT
PACK TOTAL KNEE (MISCELLANEOUS) ×2 IMPLANT
PAD WRAPON POLAR KNEE (MISCELLANEOUS) ×1 IMPLANT
PATELLA DOME PFC 35MM (Knees) ×2 IMPLANT
PENCIL SMOKE EVACUATOR COATED (MISCELLANEOUS) ×2 IMPLANT
PIN DRILL FIX HALF THREAD (BIT) ×4 IMPLANT
PIN DRILL QUICK PACK ×4 IMPLANT
PULSAVAC PLUS IRRIG FAN TIP (DISPOSABLE) ×2
SPONGE T-LAP 18X18 ~~LOC~~+RFID (SPONGE) ×6 IMPLANT
STAPLER SKIN PROX 35W (STAPLE) ×2 IMPLANT
SUCTION FRAZIER HANDLE 10FR (MISCELLANEOUS) ×1
SUCTION TUBE FRAZIER 10FR DISP (MISCELLANEOUS) ×1 IMPLANT
SUT ETHIBOND NAB CT1 #1 30IN (SUTURE) ×4 IMPLANT
SUT VIC AB 0 CT1 36 (SUTURE) ×2 IMPLANT
SUT VIC AB 2-0 CT1 (SUTURE) ×4 IMPLANT
SYR 20ML LL LF (SYRINGE) ×2 IMPLANT
SYR 30ML LL (SYRINGE) ×4 IMPLANT
TIBIA MBT CEMENT (Knees) ×2 IMPLANT
TIP FAN IRRIG PULSAVAC PLUS (DISPOSABLE) ×1 IMPLANT
TOWER CARTRIDGE SMART MIX (DISPOSABLE) ×2 IMPLANT
TRAY FOLEY MTR SLVR 16FR STAT (SET/KITS/TRAYS/PACK) ×2 IMPLANT
TUBE SUCT KAM VAC (TUBING) ×2 IMPLANT
WATER STERILE IRR 1000ML POUR (IV SOLUTION) ×2 IMPLANT
WATER STERILE IRR 500ML POUR (IV SOLUTION) IMPLANT
WRAPON POLAR PAD KNEE (MISCELLANEOUS) ×2

## 2021-03-31 NOTE — Progress Notes (Signed)
Pt wishes to keep knee immobilizer off. LPN reinforced the importance of wearing knee immobilizer post surgery and that nothing can go under the left knee.

## 2021-03-31 NOTE — Evaluation (Signed)
Physical Therapy Evaluation Patient Details Name: Alec Jennings MRN: [id number] DOB: 16-Jul-1950 Today's Date: 03/31/2021  History of Present Illness  Alec Jennings is a 70yoM who comes to [facility] on 03/31/21 for elective Lt TKA. Pt reports significant pain and difficulty with mobility prior to admission, but not physical assist needed and no DME use.  Clinical Impression  Pt admitted with above diagnosis. Pt currently with functional limitations due to the deficits listed below (see "PT Problem List"). Upon entry, pt in bed, awake and agreeable to participate. The pt is alert, pleasant, interactive, and able to provide info regarding prior level of function, both in tolerance and independence. Pt in a great deal of pain at entry, which does limit his motivation while awaits additional meds. Pt agreeable to tolerated activity. Pt educated on precautions and positioning, exercises with handout. Pt initially denies desire to mobilize, but impulsively beging to move to EOB, and then impulsively rising to standing without warning and without RW. Pt highly motivated to regain ability to walk so his foley cath can be DC ASAP. Pt left at EOB with friend in room, asked to sit for 5-10 minutes and work on deep inspiration with full lung capacity. RN contacted about pain meds, etc. Patient's performance this date reveals decreased ability, independence, and tolerance in performing all basic mobility required for performance of activities of daily living. Pt requires additional DME, close physical assistance, and cues for safe participate in mobility. Pt will benefit from skilled PT intervention to increase independence and safety with basic mobility in preparation for discharge to the venue listed below.          Recommendations for follow up therapy are one component of a multi-disciplinary discharge planning process, led by the attending physician.  Recommendations may be updated based on patient status, additional  functional criteria and insurance authorization.  Follow Up Recommendations Home health PT    Assistance Recommended at Discharge Set up Supervision/Assistance  Functional Status Assessment Patient has had a recent decline in their functional status and demonstrates the ability to make significant improvements in function in a reasonable and predictable amount of time.  Equipment Recommendations  Rolling walker (2 wheels);BSC/3in1    Recommendations for Other Services       Precautions / Restrictions Precautions Precautions: Fall;Knee Restrictions Weight Bearing Restrictions: Yes LLE Weight Bearing: Weight bearing as tolerated      Mobility  Bed Mobility Overal bed mobility: Modified Independent                  Transfers Overall transfer level: Modified independent Equipment used: None               General transfer comment: Pt impulsively rises to standing without warning, no DME used, reports minimal to no attempted use of surgical leg    Ambulation/Gait Ambulation/Gait assistance:  (deferred due to uncontrolled pain, waiting on pain meds)                Stairs            Wheelchair Mobility    Modified Rankin (Stroke Patients Only)       Balance Overall balance assessment: Modified Independent                                           Pertinent Vitals/Pain Pain Assessment: 0-10 Pain Score: 6  Pain Location: operative  knee Pain Descriptors / Indicators: Aching;Operative site guarding Pain Intervention(s): Limited activity within patient's tolerance    Home Living Family/patient expects to be discharged to:: Private residence Living Arrangements: Spouse/significant other Available Help at Discharge: Family;Friend(s) Type of Home: House Home Access: Stairs to enter Entrance Stairs-Rails: Left Entrance Stairs-Number of Steps: 4   Home Layout: One level Home Equipment: None      Prior Function Prior Level  of Function : Independent/Modified Independent             Mobility Comments: lots ot pain limitation from operative knee, but 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 ADLs Comments: independent     Hand Dominance        Extremity/Trunk Assessment                Communication      Cognition Arousal/Alertness: Awake/alert Behavior During Therapy: WFL for tasks assessed/performed Overall Cognitive Status: Within Functional Limits for tasks assessed                                          General Comments      Exercises Total Joint Exercises Ankle Circles/Pumps: AROM;Both;15 reps;Supine Quad Sets: AROM;Left;10 reps;Supine Short Arc Quad: AROM;Right;10 reps;Supine;Limitations Short Arc Quad Limitations: CL limb demo Heel Slides: AROM;AAROM;Both;5 reps;Limitations;Supine Heel Slides Limitations: A/ROM Rt; CL limb demo with bed sheet (range is fairly limited) Hip ABduction/ADduction: AAROM;Left;10 reps;Supine Straight Leg Raises: Limitations;AROM;10 reps;Supine Straight Leg Raises Limitations: deferred but able to perform fairly well with miA to remove KI. Goniometric ROM: 15-80 degrees left knee flexion ROM   Assessment/Plan    PT Assessment Patient needs continued PT services  PT Problem List Decreased strength;Decreased range of motion;Decreased activity tolerance;Decreased balance;Decreased mobility;Decreased coordination;Decreased safety awareness;Decreased knowledge of use of DME;Decreased knowledge of precautions;Pain       PT Treatment Interventions DME instruction;Gait training;Stair training;Functional mobility training;Therapeutic activities;Therapeutic exercise;Patient/family education    PT Goals (Current goals can be found in the Care Plan section)  Acute Rehab PT Goals Patient Stated Goal: reduce pain, improve AMB tolerance PT Goal Formulation: With patient Time For Goal Achievement: 04/14/21 Potential to Achieve Goals: Good    Frequency  BID   Barriers to discharge        Co-evaluation               AM-PAC PT "6 Clicks" Mobility  Outcome Measure Help needed turning from your back to your side while in a flat bed without using bedrails?: A Little Help needed moving from lying on your back to sitting on the side of a flat bed without using bedrails?: A Little Help needed moving to and from a bed to a chair (including a wheelchair)?: A Little Help needed standing up from a chair using your arms (e.g., wheelchair or bedside chair)?: A Little Help needed to walk in hospital room?: A Little Help needed climbing 3-5 steps with a railing? : A Little 6 Click Score: 18    End of Session Equipment Utilized During Treatment: Gait belt;Left knee immobilizer Activity Tolerance: Patient tolerated treatment well;Patient limited by pain Patient left: in bed;with call bell/phone within reach;with family/visitor present Nurse Communication: Mobility status PT Visit Diagnosis: Difficulty in walking, not elsewhere classified (R26.2);Other abnormalities of gait and mobility (R26.89);Muscle weakness (generalized) (M62.81)    Time: 1610-9604 PT Time Calculation (min) (ACUTE ONLY): 34 min   Charges:  PT Evaluation $PT Eval Moderate Complexity: 1 Mod PT Treatments $Self Care/Home Management: 8-22       4:37 PM, 03/31/21 Rosamaria Lints, PT, DPT Physical Therapist - [facility]  [phone] (ASCOM)    Zander Ingham C 03/31/2021, 4:33 PM

## 2021-03-31 NOTE — Anesthesia Procedure Notes (Signed)
Procedure Name: MAC Date/Time: 03/31/2021 9:23 AM Performed by: Jerrye Noble, CRNA Pre-anesthesia Checklist: Patient identified, Suction available, Emergency Drugs available and Patient being monitored Patient Re-evaluated:Patient Re-evaluated prior to induction Oxygen Delivery Method: Nasal cannula

## 2021-03-31 NOTE — Anesthesia Procedure Notes (Signed)
Spinal  Patient location during procedure: OR Start time: 03/31/2021 9:16 AM End time: 03/31/2021 9:20 AM Reason for block: surgical anesthesia Staffing Performed: resident/CRNA  Anesthesiologist: Arita Miss, MD Resident/CRNA: Jerrye Noble, CRNA Preanesthetic Checklist Completed: patient identified, IV checked, site marked, risks and benefits discussed, surgical consent, monitors and equipment checked, pre-op evaluation and timeout performed Spinal Block Patient position: sitting Prep: ChloraPrep Patient monitoring: heart rate, continuous pulse ox, blood pressure and cardiac monitor Approach: midline Location: L3-4 Injection technique: single-shot Needle Needle type: Introducer and Pencan  Needle gauge: 24 G Needle length: 9 cm Assessment Sensory level: T10 Events: CSF return Additional Notes Negative paresthesia. Negative blood return. Positive free-flowing CSF. Expiration date of kit checked and confirmed. Patient tolerated procedure well, without complications.

## 2021-03-31 NOTE — Op Note (Signed)
DATE OF SURGERY:  03/31/2021 TIME: 12:32 PM  PATIENT NAME:  Alec Jennings   AGE: 70 y.o.    PRE-OPERATIVE DIAGNOSIS:  left knee osteoarthritis  POST-OPERATIVE DIAGNOSIS:  Same  PROCEDURE:  Procedure(s): LEFT TOTAL KNEE ARTHROPLASTY  SURGEON:  Juanell Fairly, MD   ASSISTANT:  Cam Hai, PA  OPERATIVE IMPLANTS: Depuy PFC Sigma, Posterior Stabilized Femural component size 3, Tibia size rotating platform component size 3, Patella polyethylene 3-peg oval button size 35, with a 10 mm polyethylene insert.  EBL:  50 cc  TOURNIQUET TIME:  88 minutes  PREOPERATIVE INDICATIONS:  [name] is an 70 y.o. male who has a diagnosis of  left knee osteoarthritis and elected for a left total knee arthroplasty after failing nonoperative treatment.  Their knee pain significantly impacts their activity of daily living.  Radiographs have demonstrated tricompartmental osteoarthritis joint space narrowing, osteophytes, and subchondral sclerosis.  The risks, benefits, and alternatives were discussed at length including but not limited to the risks of infection, bleeding, nerve or blood vessel injury, knee stiffness, fracture, dislocation, loosening or failure of the hardware and the need for further surgery. Medical risks include but not limited to DVT and pulmonary embolism, myocardial infarction, stroke, pneumonia, respiratory failure and death. I discussed these risks with the patient in my office prior to the date of surgery. They understood these risks and were willing to proceed.  OPERATIVE FINDINGS AND UNIQUE ASPECTS OF THE CASE:  Tricompartmental left knee osteoarthritis  OPERATIVE DESCRIPTION:  The patient was brought to the operative room and placed in a supine position after undergoing placement of a spinal anesthetic.  A Foley catheter was placed.  IV antibiotics were given. Patient received Ancef 2 g IV and clindamycin 600 mg IV.  Patient also received a dose of tranexamic acid prior to the  inflation of the tourniquet.  The lower extremity was prepped and draped in the usual sterile fashion.  A time out was performed to verify the patient's name, date of birth, medical record number, correct site of surgery and correct procedure to be performed. The timeout was also used to confirm the patient received antibiotics and that appropriate instruments, implants and radiographs studies were available in the room.  The leg was elevated and exsanguinated with an Esmarch and the tourniquet was inflated to 275 mmHg for 88 minutes..  A midline incision was made over the left knee. Full-thickness skin flaps were developed. A medial parapatellar arthrotomy was then made and the patella everted and the knee was brought into 90 of flexion. Hoffa's fat pad along with the cruciate ligaments and medial and lateral menisci were resected.   The distal femoral intramedullary canal was opened with a drill and the intramedullary distal femoral cutting jig was inserted into the femoral canal pinned into position. It was set at 5 degrees resecting 10 mm off the distal femur.  Care was taken to protect the collateral ligaments during distal femoral resection.  The distal femoral resection was performed with an oscillating saw. The femoral cutting guide was then removed.  The extramedullary tibial cutting guide was then placed using the anterior tibial crest and second ray of the foot as a references.  The tibial cutting guide was adjusted to allow for appropriate posterior slope.  The tibial cutting block was pinned into position. The slotted stylus was used to measure the proximal tibial resection of 10 mm off the high lateral side.  The tibial long rod alignment guide was then used to confirm position  of the cutting block. A third cross pin through the tibial cutting block was then drilled into position to allow for rotational stability. Care was taken during the tibial resection to protect the medial and collateral  ligaments.  The resected tibial bone was removed along with the posterior horns of the menisci.  The PCL was sacrificed.  Extension gap was measured with a spacer block and alignment and extension was confirmed using a long alignment rod.  The attention was then turned back to the femur. The posterior referencing distal femoral sizing guide was applied to the distal femur.  The femur was sized to be a size 3. Rotation of the referencing guide was checked with the epicondylar axis and Whitesides line. Then the 4-in-1 cutting jig was then applied to the distal femur. A stylus was used to confirm that the anterior femur would not be notched.   Then the anterior, posterior and chamfer femoral cuts were then made with an oscillating saw.  The flexion gap was then measured with a flexion spacer block and long alignment rod and was found to be symmetric with the extension gap and perpendicular to mechanical axis of the tibia.  The distal femoral preparation was completed by performing the posterior stabilized box cut using the cutting block. The entry site for the intramedullary femoral guide was filled with autologous bone graft from bone previously resected earlier in the case.  The proximal tibia plateau was then sized with trial trays. The best coverage was achieved with a size 3. This tibial tray was then pinned into position. The proximal tibia was then prepared with the reamer and keel punch.  After tibial preparation was completed, all trial components were inserted with polyethylene trials.  The knee was found to have excellent balance and full motion with a size 10 mm tibial polyethylene insert..    The attention was then turned to preparation of the patella. The thickness of the patella was measured with a caliper, the diameter measured with the patella templates.  The patella resection was then made with an oscillating saw using the patella cutting guide.  The final patellar thickness 35 mm.  3 peg  holes for the patella component were then drilled. The trial patella was then placed. Knee was taken through a full range of motion and deemed to be stable with the trial components. All trial components were then removed. The knee capsule was then injected with Exparel.  The knee joint capsule was injected with a mixture of quarter percent Marcaine, Toradol and morphine to assist with postoperative pain relief.  The joint was copiously irrigated with pulse lavage.  The final total knee arthroplasty components were then cemented into place with a 10 mm trial polyethylene insert and all excess methylmethacrylate was removed.  The joint was again copiously irrigated. After the cement had hardened the knee was again taken through a full range of motion. It was felt to be most stable with the 10 mm tibial polyethylene insert. The actual tibial polyethylene insert was then placed.   The knee was taken through a range of motion and the patella tracked well and the knee was again irrigated copiously.    The medial arthrotomy was closed with #1 Ethibond. The subcutaneous tissue closed with 0 and 2-0 vicryl, and skin approximated with staples.  A dry sterile and compressive dressing was applied.  A Polar Care was applied to the operative knee along with a knee immobilizer.  The patient was awakened and brought  to the PACU in stable and satisfactory condition.  All sharp, lap and instrument counts were correct at the conclusion the case. I spoke with the patient's friend in the postop consultation room to let her know the case had been performed without complication and the patient was stable in recovery room.

## 2021-03-31 NOTE — Anesthesia Preprocedure Evaluation (Signed)
Anesthesia Evaluation  Patient identified by MRN, date of birth, ID band Patient awake    Reviewed: Allergy & Precautions, NPO status , Patient's Chart, lab work & pertinent test results  History of Anesthesia Complications Negative for: history of anesthetic complications  Airway Mallampati: III  TM Distance: >3 FB Neck ROM: Full    Dental no notable dental hx. (+) Teeth Intact   Pulmonary neg pulmonary ROS, neg sleep apnea, neg COPD, Patient abstained from smoking.Not current smoker, former smoker,    Pulmonary exam normal breath sounds clear to auscultation       Cardiovascular Exercise Tolerance: Good METShypertension, (-) CAD and (-) Past MI (-) dysrhythmias  Rhythm:Regular Rate:Normal - Systolic murmurs    Neuro/Psych negative neurological ROS  negative psych ROS   GI/Hepatic neg GERD  ,(+)     (-) substance abuse  ,   Endo/Other  diabetes  Renal/GU negative Renal ROS     Musculoskeletal  (+) Arthritis ,   Abdominal   Peds  Hematology   Anesthesia Other Findings Past Medical History: No date: Diabetes mellitus without complication (HCC)     Comment:  type 2 No date: Hyperlipidemia No date: Hypertension No date: Osteoarthritis of both knees  Reproductive/Obstetrics                             Anesthesia Physical Anesthesia Plan  ASA: 2  Anesthesia Plan: Spinal   Post-op Pain Management: Tylenol PO (pre-op)   Induction: Intravenous  PONV Risk Score and Plan: 2 and Ondansetron, Dexamethasone, Propofol infusion, TIVA and Treatment may vary due to age or medical condition  Airway Management Planned: Natural Airway  Additional Equipment: None  Intra-op Plan:   Post-operative Plan:   Informed Consent: I have reviewed the patients History and Physical, chart, labs and discussed the procedure including the risks, benefits and alternatives for the proposed anesthesia with  the patient or authorized representative who has indicated his/her understanding and acceptance.       Plan Discussed with: CRNA and Surgeon  Anesthesia Plan Comments: (Discussed R/B/A of neuraxial anesthesia technique with patient: - rare risks of spinal/epidural hematoma, nerve damage, infection - Risk of PDPH - Risk of nausea and vomiting - Risk of conversion to general anesthesia and its associated risks, including sore throat, damage to lips/teeth/oropharynx, and rare risks such as cardiac and respiratory events. - Risk of allergic reactions  Discussed the role of CRNA in patient's perioperative care.  Patient voiced understanding.)        Anesthesia Quick Evaluation

## 2021-03-31 NOTE — H&P (Signed)
PREOPERATIVE H&P  Chief Complaint: left knee osteoarthritis  HPI: Alec Jennings is a 70 y.o. male who presents for preoperative history and physical with a diagnosis of left knee osteoarthritis. Symptoms of pain, swelling and limited range of motion are significantly impairing activities of daily living.  Patient's x-rays demonstrate advanced tricompartmental osteoarthritis with joint space narrowing, subchondral sclerosis and osteophyte formation.  Patient's failed nonoperative management wished to proceed with a left total knee arthroplasty.  Past Medical History:  Diagnosis Date   Diabetes mellitus without complication (HCC)    type 2   Hyperlipidemia    Hypertension    Osteoarthritis of both knees    Past Surgical History:  Procedure Laterality Date   INGUINAL HERNIA REPAIR Left 2011   TUMOR REMOVAL Left    arm   Social History   Socioeconomic History   Marital status: Married    Spouse name: Remymine   Number of children: 5   Years of education: Not on file   Highest education level: Not on file  Occupational History   Not on file  Tobacco Use   Smoking status: Former    Types: Cigarettes    Quit date: 1987    Years since quitting: 35.9   Smokeless tobacco: Never  Vaping Use   Vaping Use: Never used  Substance and Sexual Activity   Alcohol use: Not Currently    Comment: rarely   Drug use: Never   Sexual activity: Yes  Other Topics Concern   Not on file  Social History Narrative   Not on file   Social Determinants of Health   Financial Resource Strain: Not on file  Food Insecurity: Not on file  Transportation Needs: Not on file  Physical Activity: Not on file  Stress: Not on file  Social Connections: Not on file   Family History  Problem Relation Age of Onset   Diabetes Mother    No Known Allergies Prior to Admission medications   Medication Sig Start Date End Date Taking? Authorizing Provider  aspirin EC 81 MG tablet Take 81 mg by mouth daily.  Swallow whole.   Yes [provider]  metoprolol succinate (TOPROL-XL) 50 MG 24 hr tablet Take 50 mg by mouth daily. Take with or immediately following a meal.   Yes [provider]  Ascorbic Acid (VITAMIN C) 1000 MG tablet Take 1,000 mg by mouth daily.    [provider]  cetirizine (ZYRTEC) 10 MG tablet Take 10 mg by mouth daily.    [provider]  diclofenac (VOLTAREN) 75 MG EC tablet Take 75 mg by mouth 2 (two) times daily.    [provider]  ezetimibe (ZETIA) 10 MG tablet Take 10 mg by mouth at bedtime.    [provider]  metFORMIN (GLUCOPHAGE) 1000 MG tablet Take 1,000 mg by mouth 2 (two) times daily with a meal.    [provider]  vitamin B-12 (CYANOCOBALAMIN) 1000 MCG tablet Take 1,000 mcg by mouth daily.    [provider]     Positive ROS: All other systems have been reviewed and were otherwise negative with the exception of those mentioned in the HPI and as above.  Physical Exam: General: Alert, no acute distress Cardiovascular: Regular rate and rhythm, no murmurs rubs or gallops.  No pedal edema Respiratory: Clear to auscultation bilaterally, no wheezes rales or rhonchi. No cyanosis, no use of accessory musculature GI: No organomegaly, abdomen is soft and non-tender nondistended with positive bowel sounds. Skin: Skin  intact, no lesions within the operative field. Neurologic: Sensation intact distally Psychiatric: Patient is competent for consent with normal mood and affect Lymphatic: No cervical lymphadenopathy  MUSCULOSKELETAL: Left knee: The patient's skin is intact. There is no erythema, ecchymosis, or large effusion. His range of motion is from -5 to approximately 115 degrees of flexion bilaterally. He has tenderness over the medial joint line of both knees. He has patellofemoral crepitus and mild grind in both knees. He demonstrates no weakness. He has no calf tenderness or lower leg edema. He has no  ligamentous laxity. He has palpable pedal pulses, intact sensation to light touch and intact motor function in both knees.   Radiology: X-ray films of both knees demonstrate tricompartmental osteoarthritis with bone-on-bone contact in the medial compartment and significant joint space narrowing in the patellofemoral compartment as well with marginal osteophytes and subchondral sclerosis.   Assessment: left knee osteoarthritis  Plan: Plan for Procedure(s): LEFT TOTAL KNEE ARTHROPLASTY  I reviewed the details of the operation as well as the postoperative course with the patient.  A preop visit and physicals performed at the bedside.  I marked the left knee according to hospital's quickset of surgery protocol after verbally confirming with the patient that this was the correct site of surgery.  I discussed the risks and benefits of surgery. The risks include but are not limited to infection requiring the removal of the prosthesis, bleeding requiring blood transfusion, nerve or blood vessel injury, joint stiffness or loss of motion, persistent knee pain, weakness or instability, fracture, dislocation, loosening or hardware failure and the need for further surgery. Medical risks include but are not limited to DVT and pulmonary embolism, myocardial infarction, stroke, pneumonia, respiratory failure and death. Patient understood these risks and wished to proceed.     Juanell Fairly, MD   03/31/2021 8:46 AM

## 2021-03-31 NOTE — Transfer of Care (Signed)
Immediate Anesthesia Transfer of Care Note  Patient: Alec Jennings  Procedure(s) Performed: LEFT TOTAL KNEE ARTHROPLASTY (Left: Knee)  Patient Location: PACU  Anesthesia Type:Spinal  Level of Consciousness: awake, drowsy and patient cooperative  Airway & Oxygen Therapy: Patient Spontanous Breathing and Patient connected to nasal cannula oxygen  Post-op Assessment: Report given to RN and Post -op Vital signs reviewed and stable  Post vital signs: Reviewed and stable  Last Vitals:  Vitals Value Taken Time  BP 113/67 03/31/21 1222  Temp    Pulse 76 03/31/21 1225  Resp 17 03/31/21 1225  SpO2 97 % 03/31/21 1225  Vitals shown include unvalidated device data.  Last Pain:  Vitals:   03/31/21 0637  TempSrc: Oral  PainSc: 0-No pain         Complications: No notable events documented.

## 2021-03-31 NOTE — Progress Notes (Signed)
Subjective:  POST OP CHECK s/p left TKA.   Patient reports left pain is moderate now but was significant earlier with physical therapy.  Patient was able to tolerate small amount of his dinner.  Objective:   VITALS:   Vitals:   03/31/21 1240 03/31/21 1245 03/31/21 1258 03/31/21 1315  BP: 122/65   125/71  Pulse: 70 69  (!) 59  Resp: 16 17  14   Temp:   97.7 F (36.5 C) 98 F (36.7 C)  TempSrc:      SpO2: 94% 95%  95%  Weight:      Height:        PHYSICAL EXAM: Left lower extremity: Neurovascular intact Sensation intact distally Intact pulses distally Dorsiflexion/Plantar flexion intact Incision: dressing C/D/I No cellulitis present Compartment soft  LABS  Results for orders placed or performed during the hospital encounter of 03/31/21 (from the past 24 hour(s))  Glucose, capillary     Status: Abnormal   Collection Time: 03/31/21  6:23 AM  Result Value Ref Range   Glucose-Capillary 126 (H) 70 - 99 mg/dL  ABO/Rh     Status: None   Collection Time: 03/31/21  6:49 AM  Result Value Ref Range   ABO/RH(D)      O POS Performed at [facility], [address] [place], [place]   Glucose, capillary     Status: Abnormal   Collection Time: 03/31/21 11:45 AM  Result Value Ref Range   Glucose-Capillary 114 (H) 70 - 99 mg/dL  Glucose, capillary     Status: Abnormal   Collection Time: 03/31/21 12:31 PM  Result Value Ref Range   Glucose-Capillary 121 (H) 70 - 99 mg/dL   Comment 1 Notify RN    Comment 2 Document in Chart   CBC     Status: Abnormal   Collection Time: 03/31/21  1:28 PM  Result Value Ref Range   WBC 5.9 4.0 - 10.5 K/uL   RBC 3.84 (L) 4.22 - 5.81 MIL/uL   Hemoglobin 12.5 (L) 13.0 - 17.0 g/dL   HCT 14/08/22 (L) 53.6 - 64.4 %   MCV 92.4 80.0 - 100.0 fL   MCH 32.6 26.0 - 34.0 pg   MCHC 35.2 30.0 - 36.0 g/dL   RDW 03.4 74.2 - 59.5 %   Platelets 175 150 - 400 K/uL   nRBC 0.0 0.0 - 0.2 %  Creatinine, serum     Status: Abnormal   Collection  Time: 03/31/21  1:28 PM  Result Value Ref Range   Creatinine, Ser 1.30 (H) 0.61 - 1.24 mg/dL   GFR, Estimated 59 (L) >60 mL/min    DG Knee Left Port  Result Date: 03/31/2021 CLINICAL DATA:  Status post total left knee arthroplasty. EXAM: PORTABLE LEFT KNEE - 1-2 VIEW COMPARISON:  None. FINDINGS: The total knee arthroplasty components are well seated. No complicating features are identified. IMPRESSION: Well seated components of a total knee arthroplasty. Electronically Signed   By: 14/11/2020 M.D.   On: 03/31/2021 13:12    Assessment/Plan: Day of Surgery   Principal Problem:   S/P TKR (total knee replacement) using cement, left  Patient stable postop.  His pain is currently moderate.  I reviewed postop x-rays which demonstrate the total knee arthroplasty components are well-positioned.  There is no evidence of postop complication.  Patient was Foley catheter removed in the morning.  Labs will be drawn in the morning.  His Foley catheter will be removed in the morning.  Patient will  continue physical therapy tomorrow.  Patient will begin Lovenox for DVT prophylaxis tomorrow.    Juanell Fairly , MD 03/31/2021, 4:40 PM

## 2021-04-01 ENCOUNTER — Encounter: Payer: Self-pay | Admitting: Orthopedic Surgery

## 2021-04-01 DIAGNOSIS — M1712 Unilateral primary osteoarthritis, left knee: Secondary | ICD-10-CM | POA: Diagnosis not present

## 2021-04-01 LAB — CBC
HCT: 34.1 % — ABNORMAL LOW (ref 39.0–52.0)
Hemoglobin: 12.1 g/dL — ABNORMAL LOW (ref 13.0–17.0)
MCH: 32.3 pg (ref 26.0–34.0)
MCHC: 35.5 g/dL (ref 30.0–36.0)
MCV: 90.9 fL (ref 80.0–100.0)
Platelets: 177 10*3/uL (ref 150–400)
RBC: 3.75 MIL/uL — ABNORMAL LOW (ref 4.22–5.81)
RDW: 13.9 % (ref 11.5–15.5)
WBC: 10.2 10*3/uL (ref 4.0–10.5)
nRBC: 0 % (ref 0.0–0.2)

## 2021-04-01 LAB — BASIC METABOLIC PANEL
Anion gap: 5 (ref 5–15)
BUN: 20 mg/dL (ref 8–23)
CO2: 22 mmol/L (ref 22–32)
Calcium: 8.2 mg/dL — ABNORMAL LOW (ref 8.9–10.3)
Chloride: 108 mmol/L (ref 98–111)
Creatinine, Ser: 1.23 mg/dL (ref 0.61–1.24)
GFR, Estimated: >60 mL/min (ref >60)
Glucose, Bld: 175 mg/dL — ABNORMAL HIGH (ref 70–99)
Potassium: 3.7 mmol/L (ref 3.5–5.1)
Sodium: 135 mmol/L (ref 135–145)

## 2021-04-01 LAB — GLUCOSE, CAPILLARY
Glucose-Capillary: 151 mg/dL — ABNORMAL HIGH (ref 70–99)
Glucose-Capillary: 185 mg/dL — ABNORMAL HIGH (ref 70–99)
Glucose-Capillary: 164 mg/dL — ABNORMAL HIGH (ref 70–99)

## 2021-04-01 NOTE — Progress Notes (Signed)
Physical Therapy Treatment Patient Details Name: Alec Jennings MRN: [id number] DOB: 29-Jan-1951 Today's Date: 04/01/2021   History of Present Illness Alec Jennings is a 70yoM who comes to [facility] on 03/31/21 for elective Lt TKA. Pt reports significant pain and difficulty with mobility prior to admission, but not physical assist needed and no DME use.    PT Comments     Pt in bed upon arrival, agreeable to session, author coordinated analgesia earlier in morning. Pt still without KI donned, LLE externally rotated due to flexed position. After discussion, pt reports confusion regarding KI use, under the impression it was for OOB use- author revisited education on KI use and promotion of TKE, importance of avoiding prolonged knee flexion in bed. Pt generally not tolerating much ROM of knee at all today, very guarded. Basic ROM requires extensive time components. Pt requires extensive cues and maximal effort for perform bed mobility and transfers. AMB is better, but pt is unable to utilize his operative limb in any appreciable degree at present (both guarding and severe ROM deficits). Author engaged patient in a very frank conversation about his current metrics and what is considered to be a typical level of progress for POD1. Pt is receptive. Pt left in recliner with feet down so he can take 10-15 minutes to work on knee flexion ROM, limited to 48 degrees during visit. Pt encouraged to then resume feet elevated and working toward [employer]. Pt has severe pain moving from 29-25 degrees extension, hence unable to place towel roll for extension.     Recommendations for follow up therapy are one component of a multi-disciplinary discharge planning process, led by the attending physician.  Recommendations may be updated based on patient status, additional functional criteria and insurance authorization.  Follow Up Recommendations  Home health PT     Assistance Recommended at Discharge Set up Supervision/Assistance   Equipment Recommendations  Rolling walker (2 wheels);BSC/3in1    Recommendations for Other Services       Precautions / Restrictions Precautions Precautions: Fall;Knee Precaution Booklet Issued: Yes (comment) Restrictions Weight Bearing Restrictions: No LLE Weight Bearing: Weight bearing as tolerated     Mobility  Bed Mobility Overal bed mobility: Needs Assistance Bed Mobility: Supine to Sit     Supine to sit: Supervision     General bed mobility comments: need cues to problem solve, avoids as much ROM in Left knee as possible while moving    Transfers Overall transfer level: Modified independent Equipment used: Rolling walker (2 wheels)               General transfer comment: max effort, un safe rising from EOB c RW; later educated on recliner transfers BUE onarm rests, left sock flip to facilitate ease of Left knee posturing with transitions.    Ambulation/Gait Ambulation/Gait assistance: Min guard Gait Distance (Feet): 30 Feet Assistive device: Rolling walker (2 wheels) Gait Pattern/deviations: Step-to pattern;Antalgic       General Gait Details: elects to TTWB LLE throughout, available ROM does not permit FFWB; momentary dizziness halfway, none thereafter   Stairs             Wheelchair Mobility    Modified Rankin (Stroke Patients Only)       Balance                                            Cognition Arousal/Alertness:  Awake/alert Behavior During Therapy: WFL for tasks assessed/performed Overall Cognitive Status: Within Functional Limits for tasks assessed                                          Exercises Total Joint Exercises Heel Slides: Limitations;Supine;AAROM;Left;15 reps Heel Slides Limitations: takes a long time, appears very uncomfortable, pt performs with bedsheet Hip ABduction/ADduction: AAROM;Left;15 reps;Supine;Limitations Hip Abduction/Adduction Limitations: self assist with bed  sheet Goniometric ROM: Left knee flexion ROM: 25-48 degrees (15-80 degrees yesterday)    General Comments        Pertinent Vitals/Pain Pain Assessment: 0-10 Pain Score: 5  Pain Location: operative knee Pain Descriptors / Indicators: Aching;Operative site guarding Pain Intervention(s): Limited activity within patient's tolerance;Monitored during session;Premedicated before session    Home Living                          Prior Function            PT Goals (current goals can now be found in the care plan section) Acute Rehab PT Goals Patient Stated Goal: reduce pain, improve AMB tolerance PT Goal Formulation: With patient Time For Goal Achievement: 04/14/21 Potential to Achieve Goals: Good Progress towards PT goals: Progressing toward goals    Frequency    BID      PT Plan Current plan remains appropriate    Co-evaluation              AM-PAC PT "6 Clicks" Mobility   Outcome Measure  Help needed turning from your back to your side while in a flat bed without using bedrails?: A Little Help needed moving from lying on your back to sitting on the side of a flat bed without using bedrails?: A Little Help needed moving to and from a bed to a chair (including a wheelchair)?: A Little Help needed standing up from a chair using your arms (e.g., wheelchair or bedside chair)?: A Little Help needed to walk in hospital room?: A Little Help needed climbing 3-5 steps with a railing? : A Little 6 Click Score: 18    End of Session Equipment Utilized During Treatment: Gait belt;Left knee immobilizer Activity Tolerance: Patient limited by pain;Patient limited by fatigue Patient left: with call bell/phone within reach;in chair (left him with feet down with cues to work on flexion ROM.) Nurse Communication: Mobility status PT Visit Diagnosis: Difficulty in walking, not elsewhere classified (R26.2);Other abnormalities of gait and mobility (R26.89);Muscle weakness  (generalized) (M62.81)     Time: 0601-5615 PT Time Calculation (min) (ACUTE ONLY): 47 min  Charges:  $Gait Training: 8-22 mins $Therapeutic Exercise: 8-22 mins $Self Care/Home Management: 8-22                    12:23 PM, 04/01/21 Rosamaria Lints, PT, DPT Physical Therapist - [facility]  [phone] (ASCOM)    Mariavictoria Nottingham C 04/01/2021, 12:11 PM

## 2021-04-01 NOTE — Progress Notes (Signed)
Met with the patient at the bedside, He lives at home with his wife and 70 year old He needs a 3 in 1 and a rolling walker [employer] will deliver to his home prior to DC Set up with Corene Cornea with [facility] for PT, the patient had no preference Has transportation and can afford his medication No additional needs

## 2021-04-01 NOTE — Evaluation (Signed)
Occupational Therapy Evaluation Patient Details Name: Alec Jennings MRN: [id number] DOB: 1950-08-20 Today's Date: 04/01/2021   History of Present Illness Alec Jennings is a 70yoM who comes to [facility] on 03/31/21 for elective Lt TKA. Pt reports significant pain and difficulty with mobility prior to admission, but not physical assist needed and no DME use.   Clinical Impression   Pt seen for OT evaluation this date, POD#1 from above surgery. Pt was independent in all ADL prior to surgery,but having increasing difficulty due to L knee pain and decreased ROM. Pt is eager to return to PLOF with less pain and improved safety and independence. Pt currently requires minimal assist for LB dressing and bathing while in seated position due to pain and limited AROM of L knee. Supv for ADL transfers +RW. Pt instructed in falls prevention, home/routines modifications, AE/DME for ADL, knee precautions, exerises and L knee positioning to optimimize passive knee extension. Handout provided to support recall and carryover. Pt would benefit from skilled OT services including additional instruction in dressing techniques with or without assistive devices for dressing and bathing skills to support recall and carryover prior to discharge and ultimately to maximize safety, independence, and minimize falls risk and caregiver burden. Do not currently anticipate any OT needs following this hospitalization.     Recommendations for follow up therapy are one component of a multi-disciplinary discharge planning process, led by the attending physician.  Recommendations may be updated based on patient status, additional functional criteria and insurance authorization.   Follow Up Recommendations  No OT follow up    Assistance Recommended at Discharge PRN  Functional Status Assessment  Patient has had a recent decline in their functional status and demonstrates the ability to make significant improvements in function in a reasonable and  predictable amount of time.  Equipment Recommendations  BSC/3in1    Recommendations for Other Services       Precautions / Restrictions Precautions Precautions: Fall;Knee Precaution Booklet Issued: Yes (comment) Restrictions Weight Bearing Restrictions: Yes LLE Weight Bearing: Weight bearing as tolerated      Mobility Bed Mobility      General bed mobility comments: NT,  up in recliner at start and end of session    Transfers Overall transfer level: Needs assistance Equipment used: Rolling walker (2 wheels) Transfers: Sit to/from Stand Sit to Stand: Supervision           General transfer comment: max effort, un safe rising from EOB c RW; later educated on recliner transfers BUE onarm rests, left sock flip to facilitate ease of Left knee posturing with transitions.      Balance Overall balance assessment: Mild deficits observed, not formally tested                                         ADL either performed or assessed with clinical judgement   ADL Overall ADL's : Needs assistance/impaired                                       General ADL Comments: Pt currently requires MIN A for LB ADL tasks 2/2 decreased strength/ROM/pain in L knee, supervision for ADL transfers and ADL mobility + RW. Pt will require assist for polar care mgt and KI mgt.     Vision  Perception     Praxis      Pertinent Vitals/Pain Pain Assessment: 0-10 Pain Score: 4  Pain Location: operative knee Pain Descriptors / Indicators: Aching;Operative site guarding Pain Intervention(s): Limited activity within patient's tolerance;Monitored during session;Premedicated before session;Repositioned;Ice applied     Hand Dominance     Extremity/Trunk Assessment Upper Extremity Assessment Upper Extremity Assessment: Overall WFL for tasks assessed   Lower Extremity Assessment Lower Extremity Assessment: LLE deficits/detail LLE Deficits / Details: s/p  TKA, expected post-op strength and ROM Deficits       Communication Communication Communication: No difficulties   Cognition Arousal/Alertness: Awake/alert Behavior During Therapy: WFL for tasks assessed/performed Overall Cognitive Status: Within Functional Limits for tasks assessed                                       General Comments       Exercises Other Exercises: Pt instructed in falls prevention, home/routines modifications, AE/DME for ADL, knee precautions, exerises and L knee positioning to optimimize passive knee extension. Handout provided to support recall and carryover.   Shoulder Instructions      Home Living Family/patient expects to be discharged to:: Private residence Living Arrangements: Spouse/significant other;Children (Alec Jennings will be 1yo this month (wife is younger, in 61s)) Available Help at Discharge: Family;Friend(s) Type of Home: House Home Access: Stairs to enter [employer] of Steps: 4 Entrance Stairs-Rails: Left Home Layout: One level     Bathroom Shower/Tub: Producer, television/film/video: Standard     Home Equipment: None;Shower seat - built in          Prior Functioning/Environment Prior Level of Function : Independent/Modified Independent;Driving             Mobility Comments: lots ot pain limitation from operative knee, but 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 ADLs Comments: independent, works in a shop, has 71yo dtr        OT Problem List: Decreased strength;Decreased range of motion;Pain;Impaired balance (sitting and/or standing);Decreased knowledge of use of DME or AE      OT Treatment/Interventions: Self-care/ADL training;Therapeutic exercise;Therapeutic activities;DME and/or AE instruction;Patient/family education;Balance training    OT Goals(Current goals can be found in the care plan section) Acute Rehab OT Goals Patient Stated Goal: go home and recover so I can get my other knee done OT Goal  Formulation: With patient Time For Goal Achievement: 04/15/21 Potential to Achieve Goals: Good ADL Goals Pt Will Perform Lower Body Dressing: with modified independence;with adaptive equipment;sit to/from stand Pt Will Transfer to Toilet: with modified independence;ambulating (BSC over toilet, LRAD PRN) Additional ADL Goal #1: Pt will independently instruct family/caregiver in polar care mgt Additional ADL Goal #2: Pt will independently instruct family/caregiver in KI mgt  OT Frequency: Min 2X/week   Barriers to D/C:            Co-evaluation              AM-PAC OT "6 Clicks" Daily Activity     Outcome Measure Help from another person eating meals?: None Help from another person taking care of personal grooming?: None Help from another person toileting, which includes using toliet, bedpan, or urinal?: A Little Help from another person bathing (including washing, rinsing, drying)?: A Little Help from another person to put on and taking off regular upper body clothing?: None Help from another person to put on and taking off regular lower body  clothing?: A Little 6 Click Score: 21   End of Session    Activity Tolerance: Patient tolerated treatment well Patient left: in chair;with call bell/phone within reach;with chair alarm set;Other (comment) (polar care in place, rolled towel under L ankle)  OT Visit Diagnosis: Other abnormalities of gait and mobility (R26.89);Pain Pain - Right/Left: Left Pain - part of body: Knee                Time: 1206-1225 OT Time Calculation (min): 19 min Charges:  OT General Charges $OT Visit: 1 Visit OT Evaluation $OT Eval Moderate Complexity: 1 Mod OT Treatments $Self Care/Home Management : 8-22 mins  Arman Filter., MPH, MS, OTR/L ascom [phone] 04/01/21, 12:41 PM

## 2021-04-01 NOTE — Progress Notes (Signed)
Subjective:  POD #1 s/p left total knee arthroplasty.   Patient reports left knee pain as marked.  Patient sitting up out of bed in a chair.  I reviewed the patient's physical therapy progress note.  Patient having pain in the left knee.  Patient has been able to urinate after removal of his Foley catheter today.  His hemoglobin and hematocrit are stable postop.  Objective:   VITALS:   Vitals:   03/31/21 1956 04/01/21 0247 04/01/21 0750 04/01/21 1159  BP: (!) 141/82 (!) 144/81 (!) 149/84 (!) 146/84  Pulse: 63 73 75 77  Resp: 16 16 20 20   Temp: 98.4 F (36.9 C) 98.5 F (36.9 C) 98.4 F (36.9 C) 98.4 F (36.9 C)  TempSrc:      SpO2: 97% 95% 97% 94%  Weight:      Height:        PHYSICAL EXAM: Left lower extremity Neurovascular intact Sensation intact distally Intact pulses distally Dorsiflexion/Plantar flexion intact Incision: dressing C/D/I No cellulitis present Compartment soft  LABS  Results for orders placed or performed during the hospital encounter of 03/31/21 (from the past 24 hour(s))  CBC     Status: Abnormal   Collection Time: 03/31/21  1:28 PM  Result Value Ref Range   WBC 5.9 4.0 - 10.5 K/uL   RBC 3.84 (L) 4.22 - 5.81 MIL/uL   Hemoglobin 12.5 (L) 13.0 - 17.0 g/dL   HCT 14/08/22 (L) 93.7 - 90.2 %   MCV 92.4 80.0 - 100.0 fL   MCH 32.6 26.0 - 34.0 pg   MCHC 35.2 30.0 - 36.0 g/dL   RDW 40.9 73.5 - 32.9 %   Platelets 175 150 - 400 K/uL   nRBC 0.0 0.0 - 0.2 %  Creatinine, serum     Status: Abnormal   Collection Time: 03/31/21  1:28 PM  Result Value Ref Range   Creatinine, Ser 1.30 (H) 0.61 - 1.24 mg/dL   GFR, Estimated 59 (L) >60 mL/min  CBC     Status: Abnormal   Collection Time: 04/01/21  2:40 AM  Result Value Ref Range   WBC 10.2 4.0 - 10.5 K/uL   RBC 3.75 (L) 4.22 - 5.81 MIL/uL   Hemoglobin 12.1 (L) 13.0 - 17.0 g/dL   HCT 14/09/22 (L) 26.8 - 34.1 %   MCV 90.9 80.0 - 100.0 fL   MCH 32.3 26.0 - 34.0 pg   MCHC 35.5 30.0 - 36.0 g/dL   RDW 96.2 22.9 - 79.8  %   Platelets 177 150 - 400 K/uL   nRBC 0.0 0.0 - 0.2 %  Basic metabolic panel     Status: Abnormal   Collection Time: 04/01/21  2:40 AM  Result Value Ref Range   Sodium 135 135 - 145 mmol/L   Potassium 3.7 3.5 - 5.1 mmol/L   Chloride 108 98 - 111 mmol/L   CO2 22 22 - 32 mmol/L   Glucose, Bld 175 (H) 70 - 99 mg/dL   BUN 20 8 - 23 mg/dL   Creatinine, Ser 14/09/22 0.61 - 1.24 mg/dL   Calcium 8.2 (L) 8.9 - 10.3 mg/dL   GFR, Estimated 1.94 >17 mL/min   Anion gap 5 5 - 15  Glucose, capillary     Status: Abnormal   Collection Time: 04/01/21  7:52 AM  Result Value Ref Range   Glucose-Capillary 151 (H) 70 - 99 mg/dL  Glucose, capillary     Status: Abnormal   Collection Time: 04/01/21 11:59 AM  Result  Value Ref Range   Glucose-Capillary 185 (H) 70 - 99 mg/dL    DG Knee Left Port  Result Date: 03/31/2021 CLINICAL DATA:  Status post total left knee arthroplasty. EXAM: PORTABLE LEFT KNEE - 1-2 VIEW COMPARISON:  None. FINDINGS: The total knee arthroplasty components are well seated. No complicating features are identified. IMPRESSION: Well seated components of a total knee arthroplasty. Electronically Signed   By: Marijo Sanes M.D.   On: 03/31/2021 13:12    Assessment/Plan: 1 Day Post-Op   Principal Problem:   S/P TKR (total knee replacement) using cement, left  Patient stable postop.  Patient having significant pain which will require continued hospitalization.  Continue with physical therapy.  Patient will begin Lovenox for DVT prophylaxis today.  Continue to elevate and use left knee Polar Care.  Continue to use knee immobilizer to keep the knee in extension when in bed.    Thornton Park , MD 04/01/2021, 12:44 PM

## 2021-04-01 NOTE — Progress Notes (Signed)
Physical Therapy Treatment Patient Details Name: Alec Jennings MRN: [id number] DOB: 1950/07/15 Today's Date: 04/01/2021   History of Present Illness Alec Jennings is a 70yoM who comes to [facility] on 03/31/21 for elective Lt TKA. Pt reports significant pain and difficulty with mobility prior to admission, but not physical assist needed and no DME use.    PT Comments    Patient alert, agreeable to PT but reported he is still having a lot of pain and knee flexion remains very limited. Session focused on progressing gait. He was able to perform transfers including bed mobility with supervision, educated on techniques to address pain as able. He ambulated ~44ft with close chair follow and CGA. Self limiting at this time, encouraged as able to maximize function. Instructed in the need to perform at least half of the loop tomorrow, pt verbalized understanding. Pt in bed at end of session, all needs in reach. The patient would benefit from further skilled PT intervention to continue to progress towards goals. Recommendation remains appropriate.       Recommendations for follow up therapy are one component of a multi-disciplinary discharge planning process, led by the attending physician.  Recommendations may be updated based on patient status, additional functional criteria and insurance authorization.  Follow Up Recommendations  Home health PT     Assistance Recommended at Discharge Set up Supervision/Assistance  Equipment Recommendations  Rolling walker (2 wheels);BSC/3in1    Recommendations for Other Services       Precautions / Restrictions Precautions Precautions: Fall;Knee Precaution Booklet Issued: Yes (comment) Restrictions Weight Bearing Restrictions: Yes LLE Weight Bearing: Weight bearing as tolerated     Mobility  Bed Mobility Overal bed mobility: Needs Assistance Bed Mobility: Sit to Supine     Supine to sit: Supervision     General bed mobility comments: NT,  up in recliner at  start and end of session    Transfers Overall transfer level: Needs assistance Equipment used: Rolling walker (2 wheels) Transfers: Sit to/from Stand Sit to Stand: Supervision                Ambulation/Gait Ambulation/Gait assistance: Min guard Gait Distance (Feet): 40 Feet Assistive device: Rolling walker (2 wheels) Gait Pattern/deviations: Step-to pattern;Antalgic       General Gait Details: elects to TTWB LLE throughout, available ROM does not permit FFWB   Stairs             Wheelchair Mobility    Modified Rankin (Stroke Patients Only)       Balance Overall balance assessment: Needs assistance Sitting-balance support: Feet supported Sitting balance-Leahy Scale: Good       Standing balance-Leahy Scale: Poor                              Cognition Arousal/Alertness: Awake/alert Behavior During Therapy: WFL for tasks assessed/performed Overall Cognitive Status: Within Functional Limits for tasks assessed                                          Exercises Total Joint Exercises Ankle Circles/Pumps: AROM;Both;15 reps;Supine Quad Sets: AROM;Left;10 reps;Seated Heel Slides: Limitations;Supine;AAROM;Left;15 reps Heel Slides Limitations: takes a long time, appears very uncomfortable, pt performs with bedsheet Hip ABduction/ADduction: AAROM;Left;Supine;Limitations;10 reps Hip Abduction/Adduction Limitations: self assist with bed sheet Goniometric ROM: Left knee flexion ROM: 25-48 degrees (15-80 degrees yesterday) Other Exercises  Other Exercises: Pt instructed in falls prevention, home/routines modifications, AE/DME for ADL, knee precautions, exerises and L knee positioning to optimimize passive knee extension. Handout provided to support recall and carryover.    General Comments        Pertinent Vitals/Pain Pain Assessment: Faces Pain Score: 4  Faces Pain Scale: Hurts even more Pain Location: operative knee Pain  Descriptors / Indicators: Aching;Operative site guarding;Grimacing;Guarding Pain Intervention(s): Limited activity within patient's tolerance;Monitored during session;Repositioned;Ice applied    Home Living Family/patient expects to be discharged to:: Private residence Living Arrangements: Spouse/significant other;Children (Child will be 2yo this month (wife is younger, in 41s)) Available Help at Discharge: Family;Friend(s) Type of Home: House Home Access: Stairs to enter Entrance Stairs-Rails: Left Entrance Stairs-Number of Steps: 4   Home Layout: One level Home Equipment: None;Shower seat - built in      Prior Function            PT Goals (current goals can now be found in the care plan section) Acute Rehab PT Goals Patient Stated Goal: reduce pain, improve AMB tolerance PT Goal Formulation: With patient Time For Goal Achievement: 04/14/21 Potential to Achieve Goals: Good Progress towards PT goals: Progressing toward goals    Frequency    BID      PT Plan Current plan remains appropriate    Co-evaluation              AM-PAC PT "6 Clicks" Mobility   Outcome Measure  Help needed turning from your back to your side while in a flat bed without using bedrails?: A Little Help needed moving from lying on your back to sitting on the side of a flat bed without using bedrails?: A Little Help needed moving to and from a bed to a chair (including a wheelchair)?: A Little Help needed standing up from a chair using your arms (e.g., wheelchair or bedside chair)?: A Little Help needed to walk in hospital room?: A Little Help needed climbing 3-5 steps with a railing? : A Little 6 Click Score: 18    End of Session Equipment Utilized During Treatment: Gait belt Activity Tolerance: Patient limited by pain;Patient limited by fatigue Patient left: in bed;with bed alarm set;with call bell/phone within reach (left him with feet down with cues to work on flexion ROM.) Nurse  Communication: Mobility status PT Visit Diagnosis: Difficulty in walking, not elsewhere classified (R26.2);Other abnormalities of gait and mobility (R26.89);Muscle weakness (generalized) (M62.81)     Time: 5361-4431 PT Time Calculation (min) (ACUTE ONLY): 23 min  Charges:  $Gait Training: 8-22 mins $Therapeutic Exercise: 8-22 mins $Self Care/Home Management: 8-22                     Olga Coaster PT, DPT 3:51 PM,04/01/21

## 2021-04-01 NOTE — Anesthesia Postprocedure Evaluation (Signed)
Anesthesia Post Note  Patient: Alec Jennings  Procedure(s) Performed: LEFT TOTAL KNEE ARTHROPLASTY (Left: Knee)  Patient location during evaluation: Nursing Unit Anesthesia Type: Spinal Level of consciousness: oriented and awake and alert Pain management: pain level controlled Vital Signs Assessment: post-procedure vital signs reviewed and stable Respiratory status: spontaneous breathing and respiratory function stable Cardiovascular status: blood pressure returned to baseline and stable Postop Assessment: no headache, no backache, no apparent nausea or vomiting and patient able to bend at knees Anesthetic complications: no   No notable events documented.   Last Vitals:  Vitals:   04/01/21 0247 04/01/21 0750  BP: (!) 144/81 (!) 149/84  Pulse: 73 75  Resp: 16 20  Temp: 36.9 C 36.9 C  SpO2: 95% 97%    Last Pain:  Vitals:   04/01/21 0226  TempSrc:   PainSc: Asleep                 Starling Manns

## 2021-04-02 DIAGNOSIS — M1712 Unilateral primary osteoarthritis, left knee: Secondary | ICD-10-CM | POA: Diagnosis not present

## 2021-04-02 MED ORDER — DOCUSATE SODIUM 100 MG PO CAPS: 100.0000 mg | ORAL_CAPSULE | Freq: Two times a day (BID) | ORAL | 0 refills | Status: AC

## 2021-04-02 MED ORDER — OXYCODONE HCL 5 MG PO TABS: 5.0000 mg | ORAL_TABLET | ORAL | 0 refills | Status: AC | PRN

## 2021-04-02 MED ORDER — ASPIRIN EC 325 MG PO TBEC: 325.0000 mg | DELAYED_RELEASE_TABLET | Freq: Two times a day (BID) | ORAL | 0 refills | Status: AC

## 2021-04-02 NOTE — Progress Notes (Signed)
  Subjective:  POD #2 s/p left total knee arthroplasty.   Patient reports left knee pain as mild to moderate.  Patient up out of bed to a chair.  I spoke with Jetta Lout, PTA who states the patient did well with therapy today and is cleared for discharge from a PT standpoint.  Objective:   VITALS:   Vitals:   04/01/21 2010 04/02/21 0337 04/02/21 0808 04/02/21 1140  BP: (!) 146/83 (!) 148/77 (!) 143/80 (!) 160/84  Pulse: 74 75 79 86  Resp: 18 16 16 17   Temp: 98.5 F (36.9 C) 98.4 F (36.9 C) 98.4 F (36.9 C) 98.1 F (36.7 C)  TempSrc: Oral Oral    SpO2: 97% 95% 95% 92%  Weight:      Height:        PHYSICAL EXAM: Left lower extremity Neurovascular intact Sensation intact distally Intact pulses distally Dorsiflexion/Plantar flexion intact Incision: dressing C/D/I No cellulitis present Compartment soft  LABS  Results for orders placed or performed during the hospital encounter of 03/31/21 (from the past 24 hour(s))  Glucose, capillary     Status: Abnormal   Collection Time: 04/01/21  4:01 PM  Result Value Ref Range   Glucose-Capillary 164 (H) 70 - 99 mg/dL    DG Knee Left Port  Result Date: 03/31/2021 CLINICAL DATA:  Status post total left knee arthroplasty. EXAM: PORTABLE LEFT KNEE - 1-2 VIEW COMPARISON:  None. FINDINGS: The total knee arthroplasty components are well seated. No complicating features are identified. IMPRESSION: Well seated components of a total knee arthroplasty. Electronically Signed   By: 14/11/2020 M.D.   On: 03/31/2021 13:12    Assessment/Plan: 2 Days Post-Op   Principal Problem:   S/P TKR (total knee replacement) using cement, left  Patient has made good progress with physical therapy.  He is ready for discharge home today.  His pain is controlled on current pain medication.  He will be discharged on enteric-coated aspirin 325 mg p.o. twice daily for DVT prophylaxis.  Patient will follow up with Dr. 14/11/2020 in the office on 04-11-2021  11:30 AM.    04-13-2021 , MD 04/02/2021, 12:19 PM

## 2021-04-02 NOTE — Progress Notes (Signed)
Physical Therapy Treatment Patient Details Name: Alec Jennings MRN: [id number] DOB: May 12, 1950 Today's Date: 04/02/2021   History of Present Illness Alec Jennings is a 70yoM who comes to [facility] on 03/31/21 for elective Lt TKA. Pt reports significant pain and difficulty with mobility prior to admission, but not physical assist needed and no DME use.    PT Comments    Pt was long sitting in bed upon arriving. He agrees to session and reports pain is much improved versus previous date. He was easily able to exit R side of bed, stand, and ambulate with RW. Ambulated ~ 120 ft to rehab gym and performed stairs without physical assistance. He tolerated session well but does endorse increased pain afterwards. Pt has cleared all acute PT goals and is safe to DC home once deemed medically stable and cleared by MD. Acute PT will continue to follow per current POC    Recommendations for follow up therapy are one component of a multi-disciplinary discharge planning process, led by the attending physician.  Recommendations may be updated based on patient status, additional functional criteria and insurance authorization.  Follow Up Recommendations  Home health PT     Assistance Recommended at Discharge Set up Supervision/Assistance  Equipment Recommendations  Rolling walker (2 wheels);BSC/3in1       Precautions / Restrictions Precautions Precautions: Fall;Knee Precaution Booklet Issued: Yes (comment) Restrictions Weight Bearing Restrictions: Yes LLE Weight Bearing: Weight bearing as tolerated     Mobility  Bed Mobility Overal bed mobility: Needs Assistance Bed Mobility: Supine to Sit;Sit to Supine     Supine to sit: Supervision Sit to supine: Supervision   Transfers Overall transfer level: Needs assistance Equipment used: Rolling walker (2 wheels) Transfers: Sit to/from Stand Sit to Stand: Supervision     Ambulation/Gait Ambulation/Gait assistance: Min guard Gait Distance (Feet): 120  Feet Assistive device: Rolling walker (2 wheels) Gait Pattern/deviations: Step-to pattern;Antalgic Gait velocity: decrease     General Gait Details: pt was able to tolerate much more wt on L LE than previously observed.   Stairs Stairs: Yes Stairs assistance: Supervision Stair Management: One rail Left;Step to pattern;Sideways Number of Stairs: 4       Balance Overall balance assessment: Needs assistance Sitting-balance support: Feet supported Sitting balance-Leahy Scale: Good     Standing balance support: Bilateral upper extremity supported;Reliant on assistive device for balance;During functional activity Standing balance-Leahy Scale: Good       Cognition Arousal/Alertness: Awake/alert Behavior During Therapy: WFL for tasks assessed/performed Overall Cognitive Status: Within Functional Limits for tasks assessed      General Comments: Pt is A and O x 4               Pertinent Vitals/Pain Pain Assessment: 0-10 Pain Score: 3  Faces Pain Scale: Hurts a little bit Pain Location: operative knee Pain Descriptors / Indicators: Aching;Operative site guarding;Grimacing;Guarding Pain Intervention(s): Limited activity within patient's tolerance;Monitored during session;Premedicated before session;Repositioned;Ice applied     PT Goals (current goals can now be found in the care plan section) Acute Rehab PT Goals Patient Stated Goal: reduce pain, improve AMB tolerance Progress towards PT goals: Progressing toward goals    Frequency    BID      PT Plan Current plan remains appropriate       AM-PAC PT "6 Clicks" Mobility   Outcome Measure  Help needed turning from your back to your side while in a flat bed without using bedrails?: A Little Help needed moving from lying on your back  to sitting on the side of a flat bed without using bedrails?: A Little Help needed moving to and from a bed to a chair (including a wheelchair)?: A Little Help needed standing up  from a chair using your arms (e.g., wheelchair or bedside chair)?: A Little Help needed to walk in hospital room?: A Little Help needed climbing 3-5 steps with a railing? : A Little 6 Click Score: 18    End of Session Equipment Utilized During Treatment: Gait belt Activity Tolerance: Patient tolerated treatment well Patient left: in bed;with bed alarm set;with call bell/phone within reach Nurse Communication: Mobility status PT Visit Diagnosis: Difficulty in walking, not elsewhere classified (R26.2);Other abnormalities of gait and mobility (R26.89);Muscle weakness (generalized) (M62.81)     Time: 8372-9021 PT Time Calculation (min) (ACUTE ONLY): 28 min  Charges:  $Gait Training: 8-22 mins $Therapeutic Activity: 8-22 mins                     Jetta Lout PTA 04/02/21, 12:08 PM

## 2021-04-02 NOTE — Plan of Care (Signed)
Patient awake most of the night. Aox4. PRN pain meds offered but patient refused. Polar care on. Dressing C/D/I. Plan of care reviewed with patient. Call bell within reach.  PLAN OF CARE ONGOING Problem: Education: Goal: Knowledge of the prescribed therapeutic regimen will improve Outcome: Progressing Goal: Individualized Educational Video(s) Outcome: Progressing   Problem: Activity: Goal: Ability to avoid complications of mobility impairment will improve Outcome: Progressing Goal: Range of joint motion will improve Outcome: Progressing   Problem: Clinical Measurements: Goal: Postoperative complications will be avoided or minimized Outcome: Progressing   Problem: Pain Management: Goal: Pain level will decrease with appropriate interventions Outcome: Progressing   Problem: Skin Integrity: Goal: Will show signs of wound healing Outcome: Progressing

## 2021-04-02 NOTE — Discharge Summary (Signed)
Physician Discharge Summary  Patient ID: Alec Jennings MRN: [id number] DOB/AGE: January 02, 1951 70 y.o.  Admit date: 03/31/2021 Discharge date: 04/02/2021  Admission Diagnoses:  left knee osteoarthritis S/P TKR (total knee replacement) using cement, left  Discharge Diagnoses:  left knee osteoarthritis Principal Problem:   S/P TKR (total knee replacement) using cement, left   Past Medical History:  Diagnosis Date   Diabetes mellitus without complication (HCC)    type 2   Hyperlipidemia    Hypertension    Osteoarthritis of both knees     Surgeries: Procedure(s): LEFT TOTAL KNEE ARTHROPLASTY on 03/31/2021   Consultants (if any): Treatment Team:  Juanell Fairly, MD  Discharged Condition: Improved  Hospital Course: Alec Jennings is an 70 y.o. male who was admitted 03/31/2021 with a diagnosis of  left knee osteoarthritis S/P TKR (total knee replacement) using cement, left and went to the operating room on 03/31/2021 and underwent an uncomplicated left total knee arthroplasty.    He was given perioperative antibiotics:  Anti-infectives (From admission, onward)    Start     Dose/Rate Route Frequency Ordered Stop   03/31/21 0618  ceFAZolin (ANCEF) 2-4 GM/100ML-% IVPB       Note to Pharmacy: Brain Hilts F: cabinet override      03/31/21 0618 03/31/21 0933   03/31/21 0617  clindamycin (CLEOCIN) 600 MG/50ML IVPB       Note to Pharmacy: Brain Hilts F: cabinet override      03/31/21 0617 03/31/21 0945   03/31/21 0600  ceFAZolin (ANCEF) IVPB 2g/100 mL premix        2 g 200 mL/hr over 30 Minutes Intravenous On call to O.R. 03/31/21 0148 03/31/21 0953   03/31/21 0200  clindamycin (CLEOCIN) IVPB 600 mg        600 mg 100 mL/hr over 30 Minutes Intravenous  Once 03/31/21 0148 03/31/21 1014     .  He was given sequential compression devices, early ambulation, and Lovenox for DVT prophylaxis.  Patient was admitted postoperatively for neurovascular monitoring and pain control.  He  received 24 hours of postop antibiotics and Lovenox for DVT prophylaxis.  Patient had improvement in his pain and made good progress with physical therapy while an inpatient.  Given his clinical improvement he is prepared for discharge home on postop day #2.  He benefited maximally from the hospital stay and there were no complications.    Recent vital signs:  Vitals:   04/02/21 0808 04/02/21 1140  BP: (!) 143/80 (!) 160/84  Pulse: 79 86  Resp: 16 17  Temp: 98.4 F (36.9 C) 98.1 F (36.7 C)  SpO2: 95% 92%    Recent laboratory studies:  Lab Results  Component Value Date   HGB 12.1 (L) 04/01/2021   HGB 12.5 (L) 03/31/2021   HGB 13.9 03/29/2021   Lab Results  Component Value Date   WBC 10.2 04/01/2021   PLT 177 04/01/2021   Lab Results  Component Value Date   INR 1.1 03/29/2021   Lab Results  Component Value Date   NA 135 04/01/2021   K 3.7 04/01/2021   CL 108 04/01/2021   CO2 22 04/01/2021   BUN 20 04/01/2021   CREATININE 1.23 04/01/2021   GLUCOSE 175 (H) 04/01/2021    Discharge Medications:   Allergies as of 04/02/2021   No Known Allergies      Medication List     STOP taking these medications    diclofenac 75 MG EC tablet Commonly known as: VOLTAREN  TAKE these medications    aspirin EC 325 MG tablet Take 1 tablet (325 mg total) by mouth 2 (two) times daily. What changed:  medication strength how much to take when to take this additional instructions   cetirizine 10 MG tablet Commonly known as: ZYRTEC Take 10 mg by mouth daily.   docusate sodium 100 MG capsule Commonly known as: COLACE Take 1 capsule (100 mg total) by mouth 2 (two) times daily.   ezetimibe 10 MG tablet Commonly known as: ZETIA Take 10 mg by mouth at bedtime.   metFORMIN 1000 MG tablet Commonly known as: GLUCOPHAGE Take 1,000 mg by mouth 2 (two) times daily with a meal.   metoprolol succinate 50 MG 24 hr tablet Commonly known as: TOPROL-XL Take 50 mg by mouth  daily. Take with or immediately following a meal.   oxyCODONE 5 MG immediate release tablet Commonly known as: Oxy IR/ROXICODONE Take 1-2 tablets (5-10 mg total) by mouth every 4 (four) hours as needed for moderate pain (pain score 4-6).   vitamin B-12 1000 MCG tablet Commonly known as: CYANOCOBALAMIN Take 1,000 mcg by mouth daily.   vitamin C 1000 MG tablet Take 1,000 mg by mouth daily.               Durable Medical Equipment  (From admission, onward)           Start     Ordered   04/01/21 1307  For home use only DME Walker rolling  Once       Question Answer Comment  Walker: With 5 Inch Wheels   Patient needs a walker to treat with the following condition Weakness      04/01/21 1306            Diagnostic Studies: DG Knee Left Port  Result Date: 03/31/2021 CLINICAL DATA:  Status post total left knee arthroplasty. EXAM: PORTABLE LEFT KNEE - 1-2 VIEW COMPARISON:  None. FINDINGS: The total knee arthroplasty components are well seated. No complicating features are identified. IMPRESSION: Well seated components of a total knee arthroplasty. Electronically Signed   By: Rudie Meyer M.D.   On: 03/31/2021 13:12    Disposition: Discharge disposition: 01-Home or Self Care       Discharge Instructions     Call MD / Call 911   Complete by: As directed    If you experience chest pain or shortness of breath, CALL 911 and be transported to the hospital emergency room.  If you develope a fever above 101 F, pus (white drainage) or increased drainage or redness at the wound, or calf pain, call your surgeon's office.   Constipation Prevention   Complete by: As directed    Drink plenty of fluids.  Prune juice may be helpful.  You may use a stool softener, such as Colace (over the counter) 100 mg twice a day.  Use MiraLax (over the counter) for constipation as needed.   Diet general   Complete by: As directed    Discharge instructions   Complete by: As directed     Follow-up with Dr. Martha Clan on 04-11-2021 @ 11:30 AM  First postoperative physical therapy visit at [facility] is on 04-04-2021 1:00 PM, Berenice Primas, PT  The patient may continue to bear weight on the left lower extremity with use of a walker. The patient should continue to use TED stockings until follow-up. Patient should remove the TED stockings at night for sleep. The patient needs to continue to elevate the  left lower extremity whenever possible. The knee immobilizer should be used at night. The patient may remove the knee immobilizer to perform exercises or sit in a chair during the day.  Patient should not place a pillow under their knee. The Polar Care may be used by the patient for comfort.  The dressing should remain on until follow up in the office.   The patient must cover the left knee dressing/incision during showers with a plastic bag or Saran wrap.  The patient will take aspirin 325 mg by mouth twice  a day for blood clot prevention and continue to work on knee range of motion exercises at home as instructed by physical therapy until follow-up in the office.   Driving restrictions   Complete by: As directed    No driving for until follow-up with Dr. Martha Clan on 04-11-2021 11:30 AM   Increase activity slowly as tolerated   Complete by: As directed    Lifting restrictions   Complete by: As directed    No lifting for 12-16 weeks   Post-operative opioid taper instructions:   Complete by: As directed    POST-OPERATIVE OPIOID TAPER INSTRUCTIONS: It is important to wean off of your opioid medication as soon as possible. If you do not need pain medication after your surgery it is ok to stop day one. Opioids include: Codeine, Hydrocodone(Norco, Vicodin), Oxycodone(Percocet, oxycontin) and hydromorphone amongst others.  Long term and even short term use of opiods can cause: Increased pain response Dependence Constipation Depression Respiratory depression And more.   Withdrawal symptoms can include Flu like symptoms Nausea, vomiting And more Techniques to manage these symptoms Hydrate well Eat regular healthy meals Stay active Use relaxation techniques(deep breathing, meditating, yoga) Do Not substitute Alcohol to help with tapering If you have been on opioids for less than two weeks and do not have pain than it is ok to stop all together.  Plan to wean off of opioids This plan should start within one week post op of your joint replacement. Maintain the same interval or time between taking each dose and first decrease the dose.  Cut the total daily intake of opioids by one tablet each day Next start to increase the time between doses. The last dose that should be eliminated is the evening dose.             Signed: Juanell Fairly ,MD 04/02/2021, 12:29 PM

## 2022-11-10 IMAGING — DX DG KNEE 1-2V PORT*L*
2 series · 2 of 2 positions shown · non-contrast
Comparison: None.

CLINICAL DATA: Status post total left knee arthroplasty.

EXAM:
PORTABLE LEFT KNEE - 1-2 VIEW

[knee ap]
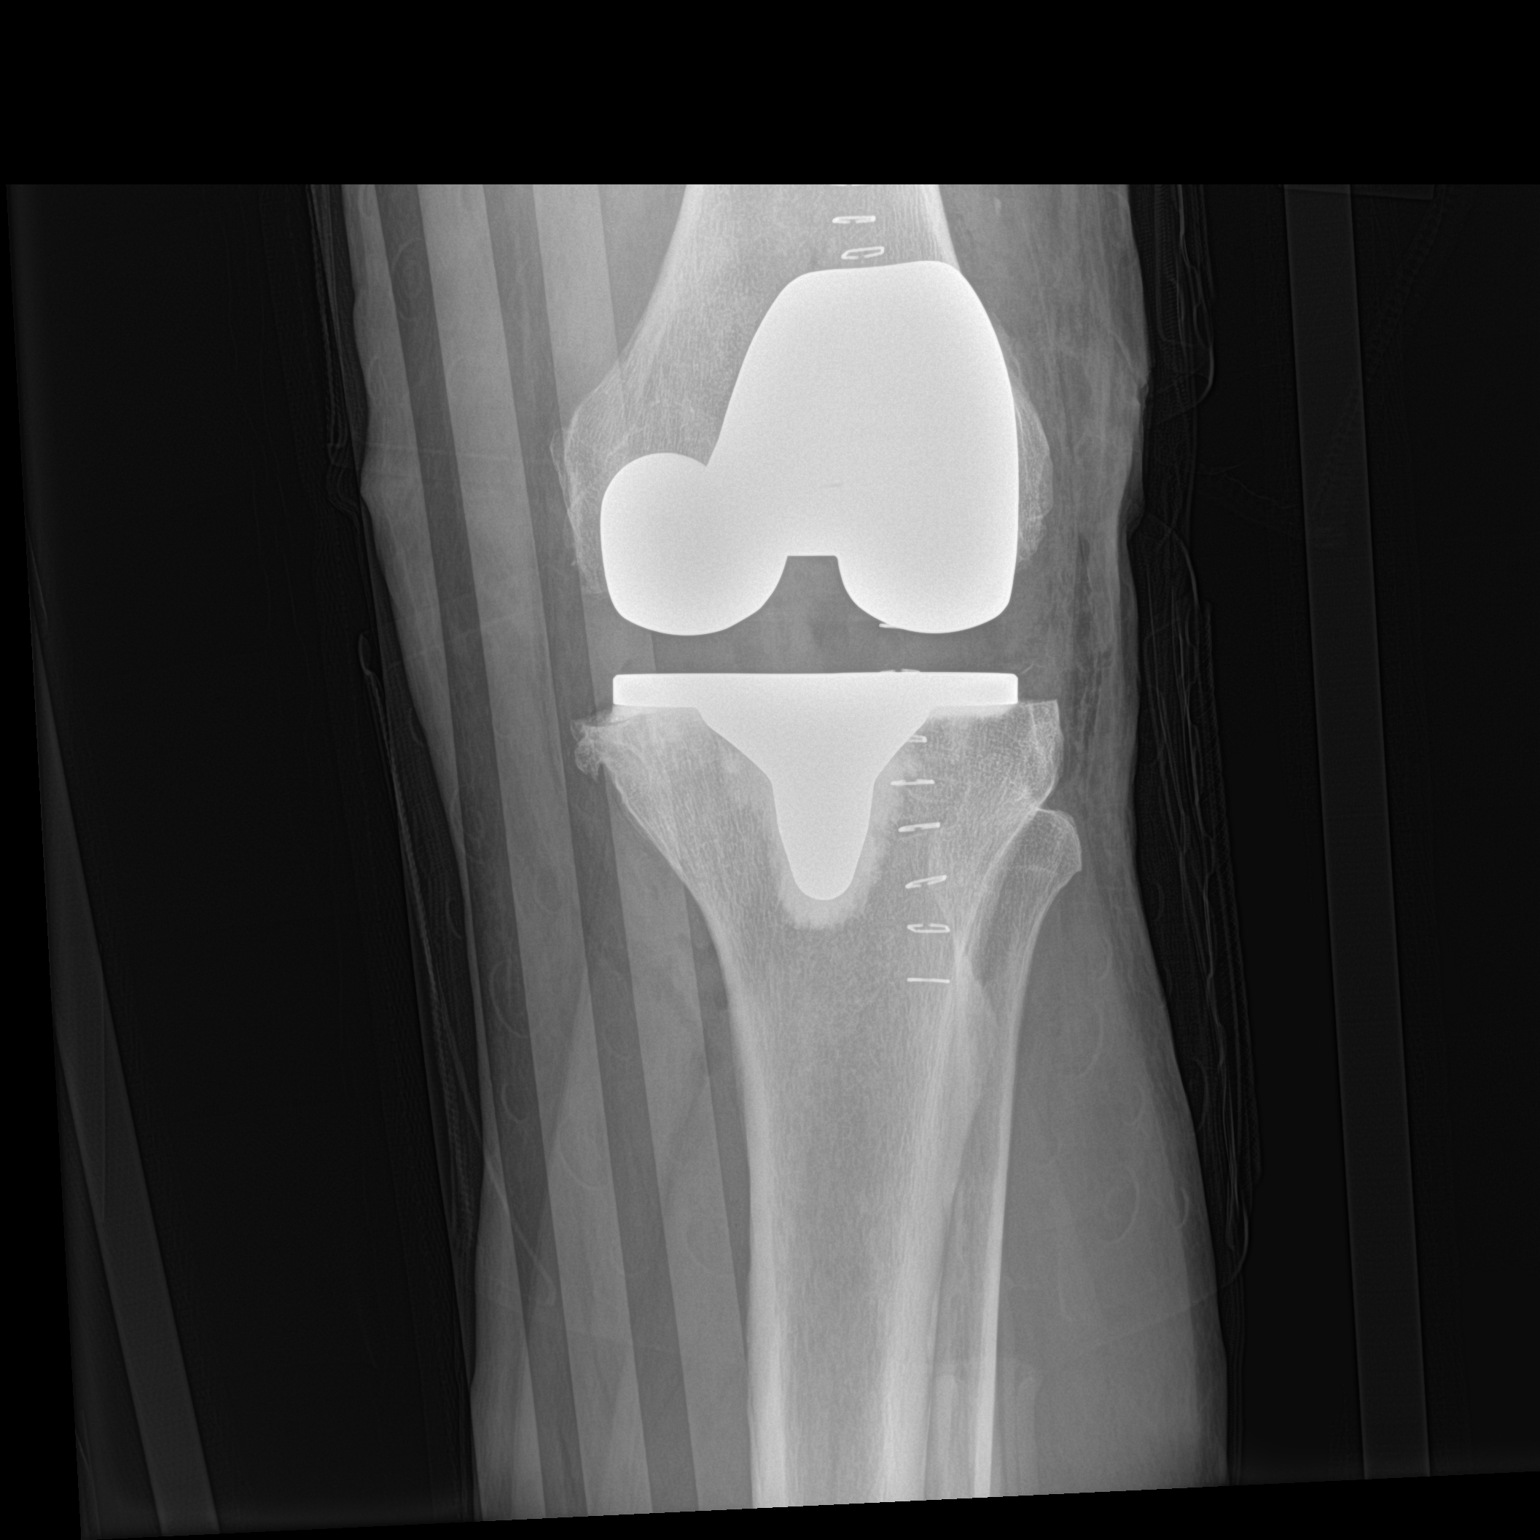

[knee lat]
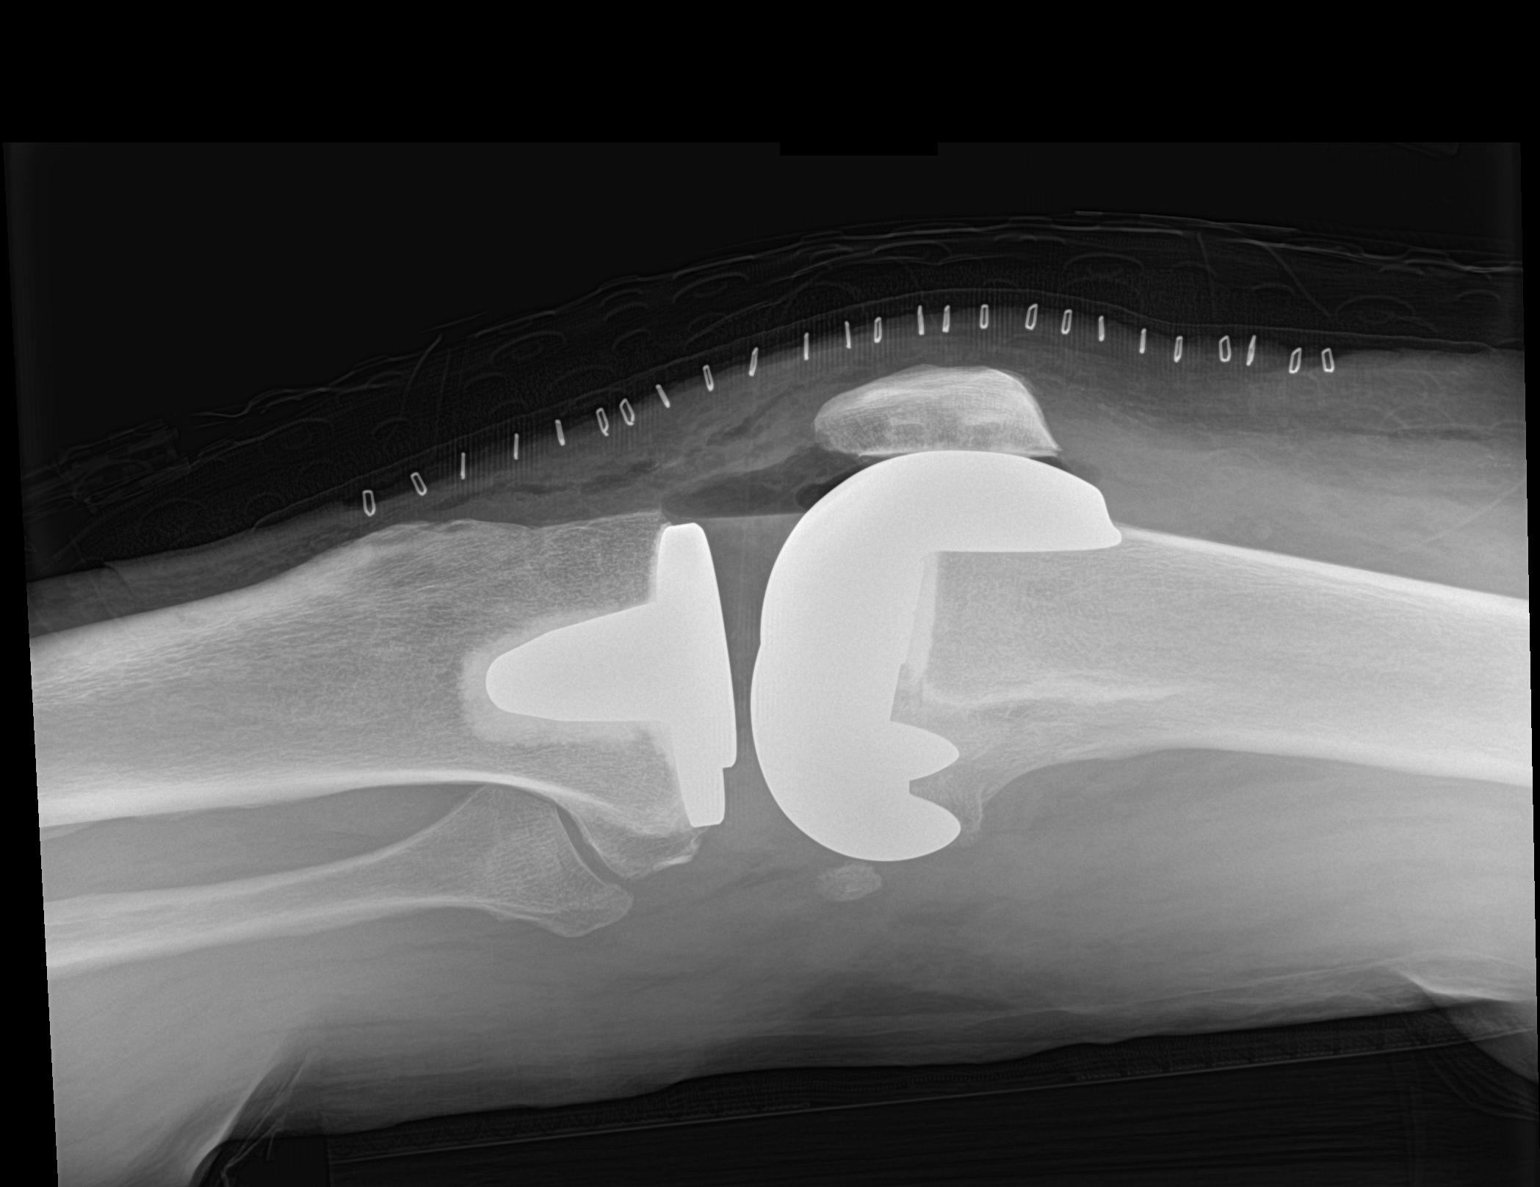

[2 of 2 positions shown; findings below may reference images not displayed]

FINDINGS: The total knee arthroplasty components are well seated. No
complicating features are identified.
IMPRESSION: Well seated components of a total knee arthroplasty.
